# Patient Record
Sex: Female | Born: 1985 | Race: Black or African American | Hispanic: No | Marital: Single | Smoking: Former smoker
Health system: Southern US, Community
[De-identification: ages and names within clinical notes are randomized; demographics above are authoritative.]

## PROBLEM LIST (undated history)

## (undated) ENCOUNTER — Inpatient Hospital Stay (HOSPITAL_COMMUNITY): Payer: Self-pay

## (undated) DIAGNOSIS — K219 Gastro-esophageal reflux disease without esophagitis: Secondary | ICD-10-CM

## (undated) DIAGNOSIS — F909 Attention-deficit hyperactivity disorder, unspecified type: Secondary | ICD-10-CM

## (undated) DIAGNOSIS — F419 Anxiety disorder, unspecified: Secondary | ICD-10-CM

## (undated) DIAGNOSIS — F329 Major depressive disorder, single episode, unspecified: Secondary | ICD-10-CM

## (undated) DIAGNOSIS — F32A Depression, unspecified: Secondary | ICD-10-CM

## (undated) HISTORY — PX: NO PAST SURGERIES: SHX2092

## (undated) HISTORY — DX: Major depressive disorder, single episode, unspecified: F32.9

## (undated) HISTORY — PX: LEEP: SHX91

## (undated) HISTORY — DX: Depression, unspecified: F32.A

---
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
## 2005-06-04 ENCOUNTER — Emergency Department (HOSPITAL_COMMUNITY): Admission: EM | Admit: 2005-06-04 | Discharge: 2005-06-04 | Payer: Self-pay | Admitting: Emergency Medicine

## 2008-05-08 ENCOUNTER — Emergency Department (HOSPITAL_COMMUNITY): Admission: EM | Admit: 2008-05-08 | Discharge: 2008-05-08 | Payer: Self-pay | Admitting: Emergency Medicine

## 2008-08-07 ENCOUNTER — Emergency Department (HOSPITAL_COMMUNITY): Admission: EM | Admit: 2008-08-07 | Discharge: 2008-08-07 | Payer: Self-pay | Admitting: Emergency Medicine

## 2008-08-20 ENCOUNTER — Ambulatory Visit (HOSPITAL_COMMUNITY): Admission: RE | Admit: 2008-08-20 | Discharge: 2008-08-20 | Payer: Self-pay | Admitting: Obstetrics and Gynecology

## 2008-08-24 ENCOUNTER — Inpatient Hospital Stay (HOSPITAL_COMMUNITY): Admission: AD | Admit: 2008-08-24 | Discharge: 2008-08-24 | Payer: Self-pay | Admitting: Obstetrics and Gynecology

## 2008-10-14 ENCOUNTER — Emergency Department (HOSPITAL_COMMUNITY): Admission: EM | Admit: 2008-10-14 | Discharge: 2008-10-14 | Payer: Self-pay | Admitting: Emergency Medicine

## 2011-06-24 LAB — HCG, QUANTITATIVE, PREGNANCY: hCG, Beta Chain, Quant, S: 307 m[IU]/mL — ABNORMAL HIGH (ref <5)

## 2011-06-24 LAB — ABO/RH: ABO/RH(D): B POS

## 2012-07-30 DIAGNOSIS — Z8759 Personal history of other complications of pregnancy, childbirth and the puerperium: Secondary | ICD-10-CM

## 2012-08-01 DIAGNOSIS — Z789 Other specified health status: Secondary | ICD-10-CM | POA: Diagnosis present

## 2012-11-08 ENCOUNTER — Emergency Department (HOSPITAL_COMMUNITY)
Admission: EM | Admit: 2012-11-08 | Discharge: 2012-11-08 | Disposition: A | Discharge status: Home / Self Care | Payer: Medicaid Other | Attending: Emergency Medicine | Admitting: Emergency Medicine

## 2012-11-08 DIAGNOSIS — F3289 Other specified depressive episodes: Secondary | ICD-10-CM | POA: Insufficient documentation

## 2012-11-08 DIAGNOSIS — R5381 Other malaise: Secondary | ICD-10-CM | POA: Insufficient documentation

## 2012-11-08 DIAGNOSIS — G479 Sleep disorder, unspecified: Secondary | ICD-10-CM | POA: Insufficient documentation

## 2012-11-08 DIAGNOSIS — F39 Unspecified mood [affective] disorder: Secondary | ICD-10-CM | POA: Insufficient documentation

## 2012-11-08 DIAGNOSIS — Z3202 Encounter for pregnancy test, result negative: Secondary | ICD-10-CM | POA: Insufficient documentation

## 2012-11-08 DIAGNOSIS — R5383 Other fatigue: Secondary | ICD-10-CM | POA: Insufficient documentation

## 2012-11-08 DIAGNOSIS — F411 Generalized anxiety disorder: Secondary | ICD-10-CM | POA: Insufficient documentation

## 2012-11-08 DIAGNOSIS — F329 Major depressive disorder, single episode, unspecified: Secondary | ICD-10-CM | POA: Insufficient documentation

## 2012-11-08 LAB — CBC WITH DIFFERENTIAL/PLATELET
Basophils Absolute: 0 10*3/uL (ref 0.0–0.1)
Eosinophils Absolute: 0.1 10*3/uL (ref 0.0–0.7)
Eosinophils Relative: 1 % (ref 0–5)
Lymphocytes Relative: 26 % (ref 12–46)
MCH: 32 pg (ref 26.0–34.0)
MCV: 95.5 fL (ref 78.0–100.0)
Neutrophils Relative %: 64 % (ref 43–77)
Platelets: 257 10*3/uL (ref 150–400)
RDW: 13.2 % (ref 11.5–15.5)
WBC: 6.8 10*3/uL (ref 4.0–10.5)

## 2012-11-08 LAB — BASIC METABOLIC PANEL
Calcium: 8.8 mg/dL (ref 8.4–10.5)
GFR calc non Af Amer: >90 mL/min (ref >90)
Potassium: 3.7 mEq/L (ref 3.5–5.1)
Sodium: 141 mEq/L (ref 135–145)

## 2012-11-08 LAB — RAPID URINE DRUG SCREEN, HOSP PERFORMED
Cocaine: NOT DETECTED
Opiates: NOT DETECTED

## 2012-11-08 LAB — URINALYSIS, ROUTINE W REFLEX MICROSCOPIC
Nitrite: NEGATIVE
Protein, ur: 30 mg/dL — AB
Specific Gravity, Urine: 1.036 — ABNORMAL HIGH (ref 1.005–1.030)
Urobilinogen, UA: 0.2 mg/dL (ref 0.0–1.0)

## 2012-11-08 LAB — URINE MICROSCOPIC-ADD ON

## 2012-11-08 MED ORDER — LORAZEPAM 1 MG PO TABS: 1.0000 mg | ORAL_TABLET | Freq: Three times a day (TID) | ORAL | Status: DC | PRN

## 2012-11-08 MED ORDER — IBUPROFEN 600 MG PO TABS: 600.0000 mg | ORAL_TABLET | Freq: Three times a day (TID) | ORAL | Status: DC | PRN

## 2012-11-08 MED ORDER — ALUM & MAG HYDROXIDE-SIMETH 200-200-20 MG/5ML PO SUSP: 30.0000 mL | ORAL | Status: DC | PRN

## 2012-11-08 MED ORDER — ZOLPIDEM TARTRATE 5 MG PO TABS: 5.0000 mg | ORAL_TABLET | Freq: Every evening | ORAL | Status: DC | PRN

## 2012-11-08 MED ORDER — ONDANSETRON HCL 4 MG PO TABS: 4.0000 mg | ORAL_TABLET | Freq: Three times a day (TID) | ORAL | Status: DC | PRN

## 2012-11-08 MED ORDER — ACETAMINOPHEN 325 MG PO TABS: 650.0000 mg | ORAL_TABLET | ORAL | Status: DC | PRN

## 2012-11-08 MED ORDER — CITALOPRAM HYDROBROMIDE 10 MG PO TABS: 10.0000 mg | ORAL_TABLET | Freq: Every day | ORAL | Status: DC

## 2012-11-08 NOTE — ED Notes (Signed)
Pt states that she has had a hx of depression and had a breakup and has not been able to sleep or get out of bed or eat. Denies SI/HI states that she wants help for depression. Pt is tearful at this time and is here with mother.

## 2012-11-08 NOTE — ED Provider Notes (Signed)
History     CSN: [id number]  Arrival date & time 11/08/12  1331   First MD Initiated Contact with Patient 11/08/12 1407      Chief Complaint  Patient presents with  . Depression    (Consider location/radiation/quality/duration/timing/severity/associated sxs/prior treatment) HPI Comments: Pt feeling "depressed" after break up with boyfriend. Admits difficulty sleeping, loss of interests, feelings of worthlessness, lack of energy, difficulty concentrating, loss of appetite, feelings of anxiety Denies SI/HI, audio/visual hallucinations. Requesting behavioral health assistance.    No past medical history on file.  No past surgical history on file.  No family history on file.  History  Substance Use Topics  . Smoking status: Not on file  . Smokeless tobacco: Not on file  . Alcohol Use: Not on file    OB History   No data available      Review of Systems  Constitutional: Negative for fever and diaphoresis.  HENT: Negative for neck pain and neck stiffness.   Eyes: Negative for visual disturbance.  Respiratory: Negative for apnea, chest tightness and shortness of breath.   Cardiovascular: Negative for chest pain and palpitations.  Gastrointestinal: Negative for nausea, vomiting, diarrhea and constipation.  Genitourinary: Negative for dysuria.  Musculoskeletal: Negative for gait problem.  Skin: Negative for rash.  Neurological: Negative for dizziness, weakness, light-headedness, numbness and headaches.  Psychiatric/Behavioral: Positive for sleep disturbance and dysphoric mood. Negative for suicidal ideas, hallucinations, self-injury and agitation.    Allergies  Review of patient's allergies indicates no known allergies.  Home Medications  No current outpatient prescriptions on file.  BP 119/82  Pulse 77  Temp(Src) 97.8 F (36.6 C) (Oral)  SpO2 100%  Physical Exam  Nursing note and vitals reviewed. Constitutional: She is oriented to person, place, and time. She  appears well-developed and well-nourished.  tearful  HENT:  Head: Normocephalic and atraumatic.  Eyes: EOM are normal. Pupils are equal, round, and reactive to light.  Neck: Normal range of motion. Neck supple.  No meningeal signs  Cardiovascular: Normal rate, regular rhythm and normal heart sounds.  Exam reveals no gallop and no friction rub.   No murmur heard. Pulmonary/Chest: Effort normal and breath sounds normal. No respiratory distress. She has no wheezes. She has no rales. She exhibits no tenderness.  Abdominal: Soft. Bowel sounds are normal. She exhibits no distension. There is no tenderness. There is no rebound and no guarding.  Musculoskeletal: Normal range of motion. She exhibits no edema and no tenderness.  Neurological: She is alert and oriented to person, place, and time. No cranial nerve deficit.  Skin: Skin is warm and dry. She is not diaphoretic. No erythema.  Psychiatric: Her behavior is normal. Thought content normal.  Dysphoric mood, admits difficulty sleeping, loss of interests, feelings of worthlessness, lack of energy, difficulty concentrating, loss of appetite, feelings of anxiety     ED Course  Procedures (including critical care time)  Labs Reviewed  URINALYSIS, ROUTINE W REFLEX MICROSCOPIC  CBC WITH DIFFERENTIAL  BASIC METABOLIC PANEL  URINE RAPID DRUG SCREEN (HOSP PERFORMED)  ETHANOL  PREGNANCY, URINE    Results for orders placed during the hospital encounter of 11/08/12  CBC WITH DIFFERENTIAL      Result Value Range   WBC 6.8  4.0 - 10.5 K/uL   RBC 4.41  3.87 - 5.11 MIL/uL   Hemoglobin 14.1  12.0 - 15.0 g/dL   HCT 40.9  81.1 - 91.4 %   MCV 95.5  78.0 - 100.0 fL   MCH 32.0  26.0 - 34.0 pg   MCHC 33.5  30.0 - 36.0 g/dL   RDW 47.8  29.5 - 62.1 %   Platelets 257  150 - 400 K/uL   Neutrophils Relative 64  43 - 77 %   Neutro Abs 4.4  1.7 - 7.7 K/uL   Lymphocytes Relative 26  12 - 46 %   Lymphs Abs 1.8  0.7 - 4.0 K/uL   Monocytes Relative 9  3 - 12  %   Monocytes Absolute 0.6  0.1 - 1.0 K/uL   Eosinophils Relative 1  0 - 5 %   Eosinophils Absolute 0.1  0.0 - 0.7 K/uL   Basophils Relative 0  0 - 1 %   Basophils Absolute 0.0  0.0 - 0.1 K/uL    No results found.   No diagnosis found at time of transfer of care.    MDM  Pt presents to the ED for medical clearance.  Pt is not currently having SI or HI ideations.  The patient currently does not have any acute physical complaints and is in no acute distress. The patients demeanor is dysphoric. Admits difficulty sleeping, loss of interests, feelings of worthlessness, lack of energy, difficulty concentrating, loss of appetite, feelings of anxiety The patient was brought to ED by self and mother and is voluntarily seeking behavioral health assistance. ACT consult was appreciated and pt was moved to Psych ED for further evaluation.    Glade Nurse, PA-C 11/08/12 1451

## 2012-11-08 NOTE — ED Provider Notes (Signed)
Medical screening examination/treatment/procedure(s) were conducted as a shared visit with non-physician practitioner(s) and myself.  I personally evaluated the patient during the encounter  No homicidal or suicidal thoughts.  I do not believe the patient needs to be admitted to a psychiatric hospital.  Social work has been involved to provide counseling resources and assistance with making information.  Home with initiation of Celexa and when necessary Ativan.  Recommend followup with psychiatry and calcific services.  Lyanne Co, MD 11/08/12 [phone]

## 2012-11-08 NOTE — Progress Notes (Signed)
CSW met with pt at bedside along with pt mother. Pt denies SI/HI/AH/VH. Patient plans to follow up with outpatient providers. CSW and pt discussed applying for medicaid as patient mother requested, csw provided patient with information. CSW also discussed mental health providers that are able to provide services to patients that are uninsured. Pt and pt mother thanked csw for concern and support.   Catha Gosselin, LCSWA  [phone] .11/08/2012 1710pm

## 2013-03-30 ENCOUNTER — Encounter (HOSPITAL_COMMUNITY): Payer: Self-pay | Admitting: Emergency Medicine

## 2013-03-30 ENCOUNTER — Emergency Department (HOSPITAL_COMMUNITY)
Admission: EM | Admit: 2013-03-30 | Discharge: 2013-03-30 | Disposition: A | Discharge status: Home / Self Care | Payer: Medicaid Other | Attending: Emergency Medicine | Admitting: Emergency Medicine

## 2013-03-30 DIAGNOSIS — L259 Unspecified contact dermatitis, unspecified cause: Secondary | ICD-10-CM | POA: Insufficient documentation

## 2013-03-30 MED ORDER — PREDNISONE (PAK) 10 MG PO TABS: 10.0000 mg | ORAL_TABLET | Freq: Every day | ORAL | Status: DC

## 2013-03-30 MED ORDER — DIPHENHYDRAMINE HCL 25 MG PO TABS: 25.0000 mg | ORAL_TABLET | Freq: Four times a day (QID) | ORAL | Status: DC

## 2013-03-30 NOTE — ED Notes (Signed)
Pt from home c/o itchy rash to L upper arm. Pt reports that she has had x1week. Pt has reddish, oval shaped rash , approx 2.5 in to inside of upper arm. Pt states that she has used calamine, cortisone cream and benadryl with no relief. Pt is A&O and in NAD

## 2013-03-30 NOTE — ED Provider Notes (Signed)
History    CSN: [id number] Arrival date & time 03/30/13  1339  First MD Initiated Contact with Patient 03/30/13 1430     Chief Complaint  Patient presents with  . Rash   (Consider location/radiation/quality/duration/timing/severity/associated sxs/prior Treatment) HPI Comments: Patient reports itchy rash on left upper arm x 1 week.  States she does not know of any exposure to the area.  Denies change in any personal care products.  Denies tick bites.  Pt does have eczema but this is different from her typical eczema.  States she feels completely fine with exception of this area of her skin.  Denies fevers, chills, recent illness, cough, weakness or numbness of her arm.  Has used various OTC creams and lotions without improvement.   Patient is a 27 y.o. female presenting with rash. The history is provided by the patient.  Rash Associated symptoms: no chills and no fever    History reviewed. No pertinent past medical history. History reviewed. No pertinent past surgical history. No family history on file. History  Substance Use Topics  . Smoking status: Never Smoker   . Smokeless tobacco: Not on file  . Alcohol Use: Yes     Comment: socially   OB History   Grav Para Term Preterm Abortions TAB SAB Ect Mult Living                 Review of Systems  Constitutional: Negative for fever and chills.  Skin: Positive for rash.  Neurological: Negative for weakness and numbness.    Allergies  Review of patient's allergies indicates no known allergies.  Home Medications   Current Outpatient Rx  Name  Route  Sig  Dispense  Refill  . calamine lotion   Topical   Apply 1 application topically as needed (for rash).         . diphenhydrAMINE (BENADRYL) 25 mg capsule   Oral   Take 25 mg by mouth every 6 (six) hours as needed for itching.         . hydrocortisone cream 1 %   Topical   Apply 1 application topically 2 (two) times daily as needed (for rash).          BP  109/67  Pulse 73  Temp(Src) 99 F (37.2 C) (Oral)  Resp 16  SpO2 98%  LMP 03/07/2013 Physical Exam  Nursing note and vitals reviewed. Constitutional: She appears well-developed and well-nourished. No distress.  HENT:  Head: Normocephalic and atraumatic.  Neck: Neck supple.  Pulmonary/Chest: Effort normal.  Neurological: She is alert.  Skin: She is not diaphoretic.       ED Course  Procedures (including critical care time) Labs Reviewed - No data to display No results found.   1. Contact dermatitis     MDM  Patient with one area of raised erythematous rash on left upper arm, medial aspect.  It is ovular but does not have central clearing, targetoid appearance, and does not have distinct raised border.  Truly appears to be contact dermatitis.  Nontender.  No e/o superinfection.  No hx of exposures to chemicals, plants, ticks, or other insects.  Discussed findings, treatment, and follow up with patient.  Encouraged pt to return for any worsening symptoms or for lack of improvement. (pt does not have PCP). Pt given return precautions.  Pt verbalizes understanding and agrees with plan.     I doubt any other EMC precluding discharge at this time including, but not necessarily limited to the following:  Lyme disease, tinea corporis.    Trixie Dredge, PA-C 03/30/13 1538

## 2013-03-31 NOTE — ED Provider Notes (Signed)
Medical screening examination/treatment/procedure(s) were performed by non-physician practitioner and as supervising physician I was immediately available for consultation/collaboration.  Derwood Kaplan, MD 03/31/13 [phone]

## 2013-08-09 ENCOUNTER — Encounter: Payer: Self-pay | Admitting: Obstetrics and Gynecology

## 2013-08-09 ENCOUNTER — Ambulatory Visit (INDEPENDENT_AMBULATORY_CARE_PROVIDER_SITE_OTHER): Payer: Medicaid Other | Admitting: Obstetrics and Gynecology

## 2013-08-09 ENCOUNTER — Ambulatory Visit (HOSPITAL_COMMUNITY)
Admission: RE | Admit: 2013-08-09 | Discharge: 2013-08-09 | Disposition: A | Discharge status: Home / Self Care | Payer: Medicaid Other | Source: Ambulatory Visit | Attending: Obstetrics and Gynecology | Admitting: Obstetrics and Gynecology

## 2013-08-09 ENCOUNTER — Other Ambulatory Visit: Payer: Self-pay | Admitting: Obstetrics and Gynecology

## 2013-08-09 ENCOUNTER — Other Ambulatory Visit (HOSPITAL_COMMUNITY)
Admission: RE | Admit: 2013-08-09 | Discharge: 2013-08-09 | Disposition: A | Discharge status: Home / Self Care | Payer: Medicaid Other | Source: Ambulatory Visit | Attending: Obstetrics and Gynecology | Admitting: Obstetrics and Gynecology

## 2013-08-09 VITALS — BP 118/83 | Temp 97.0°F | Ht 62.0 in | Wt 121.1 lb

## 2013-08-09 DIAGNOSIS — Z113 Encounter for screening for infections with a predominantly sexual mode of transmission: Secondary | ICD-10-CM | POA: Insufficient documentation

## 2013-08-09 DIAGNOSIS — O2621 Pregnancy care for patient with recurrent pregnancy loss, first trimester: Secondary | ICD-10-CM

## 2013-08-09 DIAGNOSIS — Z01419 Encounter for gynecological examination (general) (routine) without abnormal findings: Secondary | ICD-10-CM | POA: Insufficient documentation

## 2013-08-09 DIAGNOSIS — Z3491 Encounter for supervision of normal pregnancy, unspecified, first trimester: Secondary | ICD-10-CM

## 2013-08-09 DIAGNOSIS — O2620 Pregnancy care for patient with recurrent pregnancy loss, unspecified trimester: Secondary | ICD-10-CM | POA: Insufficient documentation

## 2013-08-09 DIAGNOSIS — Z3689 Encounter for other specified antenatal screening: Secondary | ICD-10-CM | POA: Insufficient documentation

## 2013-08-09 DIAGNOSIS — Z8742 Personal history of other diseases of the female genital tract: Secondary | ICD-10-CM | POA: Insufficient documentation

## 2013-08-09 DIAGNOSIS — Z3490 Encounter for supervision of normal pregnancy, unspecified, unspecified trimester: Secondary | ICD-10-CM

## 2013-08-09 LAB — POCT URINALYSIS DIP (DEVICE)
Nitrite: NEGATIVE
Protein, ur: NEGATIVE mg/dL
Specific Gravity, Urine: 1.02 (ref 1.005–1.030)
Urobilinogen, UA: 0.2 mg/dL (ref 0.0–1.0)

## 2013-08-09 LAB — OB RESULTS CONSOLE GC/CHLAMYDIA: Gonorrhea: NEGATIVE

## 2013-08-09 NOTE — Progress Notes (Signed)
P=78 initial prenatal visit with labs

## 2013-08-09 NOTE — Progress Notes (Signed)
Subjective:    Kelli Bean is a [id number] [redacted]w[redacted]d being seen today for her first obstetrical visit.  Her obstetrical history is significant for ectopic x1 tx MTX last year; SABx2 at 5 wks. Patient does intend to breast feed. Pregnancy history fully reviewed.  Patient reports no complaints and has subjective sx of pregnnacy/ Very anxious about past OB hx and she and her mother are requesting progesterone as that was done with her last pregnnacy.Ceasar Mons Vitals:   08/09/13 9811 08/09/13 0823  BP: 118/83   Temp: 97 F (36.1 C)   Height:  5\' 2"  (1.575 m)  Weight: 121 lb 1.6 oz (54.931 kg)     HISTORY: OB History  Gravida Para Term Preterm AB SAB TAB Ectopic Multiple Living  4    3 2  1       # Outcome Date GA Lbr Len/2nd Weight Sex Delivery Anes PTL Lv  4 CUR           3 ECT 2013 [redacted]w[redacted]d         2 SAB 2012 [redacted]w[redacted]d         1 SAB 2009 [redacted]w[redacted]d            Past Medical History  Diagnosis Date  . Depression     2008 on medication   History reviewed. No pertinent past surgical history. Family History  Problem Relation Age of Onset  . Hypertension Mother   . Hypertension Father   . Cancer Maternal Grandmother   . Diabetes Maternal Grandmother   . Cancer Paternal Grandmother   . Diabetes Paternal Grandmother      Exam    Uterus:     Pelvic Exam:    Perineum: No Hemorrhoids, Normal Perineum   Vulva: normal   Vagina:  normal mucosa, normal discharge   Uterus Sl enlarged, retroverted   Cervix: nulliparous appearance and L/C   Adnexa: not evaluated   Bony Pelvis: average  System: Breast:  normal appearance, no masses or tenderness   Skin: normal coloration and turgor, no rashes    Neurologic: oriented, normal, grossly non-focal   Extremities: normal strength, tone, and muscle mass   HEENT PERRLA   Mouth/Teeth mucous membranes moist, pharynx normal without lesions   Neck supple and no masses   Cardiovascular: regular rate and rhythm, no murmurs or gallops   Respiratory:   appears well, vitals normal, no respiratory distress, acyanotic, normal RR, ear and throat exam is normal, neck free of mass or lymphadenopathy, chest clear, no wheezing, crepitations, rhonchi, normal symmetric air entry   Abdomen: soft, NT   Urinary: urethral meatus normal      Assessment:    Pregnancy: [id number] at [redacted]w[redacted]d Patient Active Problem List   Diagnosis Date Noted  . 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 Supervision of normal pregnancy in first trimester 08/09/2013        Plan:    Bedside US by Diane for viability> YS seen, no embryo seen> to [place] [facility] Initial labs drawn. Prenatal vitamins. Problem list reviewed and updated. Genetic Screening discussed First Screen: undecided.  Ultrasound discussed; fetal survey: requested.  Follow up in 4 weeks. 50% of 30 min visit spent on counseling and coordination of care.  D/W pt and mother re not meeting criteria for RPL and data on progesterone controversial and they understand. C/W Dr. Debroah Loop may prescribe progesterone 200 mg did first tri  if viable IUP  Kelli Bean,Kelli Bean 08/09/2013

## 2013-08-10 LAB — OBSTETRIC PANEL
Antibody Screen: NEGATIVE
Basophils Absolute: 0 10*3/uL (ref 0.0–0.1)
Basophils Relative: 0 % (ref 0–1)
HCT: 35.3 % — ABNORMAL LOW (ref 36.0–46.0)
Hemoglobin: 12.3 g/dL (ref 12.0–15.0)
Hepatitis B Surface Ag: NEGATIVE
Lymphs Abs: 2.4 10*3/uL (ref 0.7–4.0)
MCH: 32.6 pg (ref 26.0–34.0)
MCHC: 34.8 g/dL (ref 30.0–36.0)
Monocytes Absolute: 0.8 10*3/uL (ref 0.1–1.0)
Monocytes Relative: 7 % (ref 3–12)
Neutro Abs: 8.7 10*3/uL — ABNORMAL HIGH (ref 1.7–7.7)
Neutrophils Relative %: 72 % (ref 43–77)
RBC: 3.77 MIL/uL — ABNORMAL LOW (ref 3.87–5.11)
Rh Type: POSITIVE

## 2013-08-10 LAB — ALCOHOL METABOLITE (ETG), URINE: Ethyl Glucuronide (EtG): NEGATIVE ng/mL

## 2013-08-13 LAB — HEMOGLOBINOPATHY EVALUATION
Hemoglobin Other: 0 %
Hgb A2 Quant: 2.8 % (ref 2.2–3.2)
Hgb S Quant: 0 %

## 2013-08-13 LAB — PRESCRIPTION MONITORING PROFILE (19 PANEL)
Amphetamine/Meth: NEGATIVE ng/mL
Barbiturate Screen, Urine: NEGATIVE ng/mL
Carisoprodol, Urine: NEGATIVE ng/mL
Creatinine, Urine: 143.38 mg/dL (ref >20.0)
Meperidine, Ur: NEGATIVE ng/mL
Methadone Screen, Urine: NEGATIVE ng/mL
Nitrites, Initial: NEGATIVE ug/mL
Opiate Screen, Urine: NEGATIVE ng/mL
Oxycodone Screen, Ur: NEGATIVE ng/mL
Phencyclidine, Ur: NEGATIVE ng/mL
Propoxyphene: NEGATIVE ng/mL
Tapentadol, urine: NEGATIVE ng/mL
Tramadol Scrn, Ur: NEGATIVE ng/mL
pH, Initial: 5.7 pH (ref 4.5–8.9)

## 2013-08-13 LAB — CANNABANOIDS (GC/LC/MS), URINE: THC-COOH (GC/LC/MS), ur confirm: 137 ng/mL — AB

## 2013-09-06 ENCOUNTER — Encounter: Payer: Self-pay | Admitting: Advanced Practice Midwife

## 2013-09-06 ENCOUNTER — Ambulatory Visit (INDEPENDENT_AMBULATORY_CARE_PROVIDER_SITE_OTHER): Payer: Medicaid Other | Admitting: Advanced Practice Midwife

## 2013-09-06 VITALS — BP 111/72 | Temp 98.3°F | Wt 124.2 lb

## 2013-09-06 DIAGNOSIS — O2621 Pregnancy care for patient with recurrent pregnancy loss, first trimester: Secondary | ICD-10-CM

## 2013-09-06 DIAGNOSIS — Z3481 Encounter for supervision of other normal pregnancy, first trimester: Secondary | ICD-10-CM

## 2013-09-06 DIAGNOSIS — Z23 Encounter for immunization: Secondary | ICD-10-CM

## 2013-09-06 DIAGNOSIS — O2620 Pregnancy care for patient with recurrent pregnancy loss, unspecified trimester: Secondary | ICD-10-CM

## 2013-09-06 LAB — POCT URINALYSIS DIP (DEVICE)
Bilirubin Urine: NEGATIVE
Ketones, ur: NEGATIVE mg/dL
Specific Gravity, Urine: 1.025 (ref 1.005–1.030)
pH: 6 (ref 5.0–8.0)

## 2013-09-06 NOTE — Progress Notes (Signed)
P=97  C/o severe cramps for a few days, but stopped  Yesterday, denies diarrhea, denies vaginal bleeding or discharge

## 2013-09-06 NOTE — Patient Instructions (Signed)
Pregnancy - First Trimester  During sexual intercourse, millions of sperm go into the vagina. Only 1 sperm will penetrate and fertilize the female egg while it is in the Fallopian tube. One week later, the fertilized egg implants into the wall of the uterus. An embryo begins to develop into a baby. At 6 to 8 weeks, the eyes and face are formed and the heartbeat can be seen on ultrasound. At the end of 12 weeks (first trimester), all the baby's organs are formed. Now that you are pregnant, you will want to do everything you can to have a healthy baby. Two of the most important things are to get good prenatal care and follow your caregiver's instructions. Prenatal care is all the medical care you receive before the baby's birth. It is given to prevent, find, and treat problems during the pregnancy and childbirth.  PRENATAL EXAMS  · During prenatal visits, your weight, blood pressure, and urine are checked. This is done to make sure you are healthy and progressing normally during the pregnancy.  · A pregnant woman should gain 25 to 35 pounds during the pregnancy. However, if you are overweight or underweight, your caregiver will advise you regarding your weight.  · Your caregiver will ask and answer questions for you.  · Blood work, cervical cultures, other necessary tests, and a Pap test are done during your prenatal exams. These tests are done to check on your health and the probable health of your baby. Tests are strongly recommended and done for HIV with your permission. This is the virus that causes AIDS. These tests are done because medicines can be given to help prevent your baby from being born with this infection should you have been infected without knowing it. Blood work is also used to find out your blood type, previous infections, and follow your blood levels (hemoglobin).  · Low hemoglobin (anemia) is common during pregnancy. Iron and vitamins are given to help prevent this. Later in the pregnancy, blood  tests for diabetes will be done along with any other tests if any problems develop.  · You may need other tests to make sure you and the baby are doing well.  CHANGES DURING THE FIRST TRIMESTER   Your body goes through many changes during pregnancy. They vary from person to person. Talk to your caregiver about changes you notice and are concerned about. Changes can include:  · Your menstrual period stops.  · The egg and sperm carry the genes that determine what you look like. Genes from you and your partner are forming a baby. The female genes determine whether the baby is a boy or a girl.  · Your body increases in girth and you may feel bloated.  · Feeling sick to your stomach (nauseous) and throwing up (vomiting). If the vomiting is uncontrollable, call your caregiver.  · Your breasts will begin to enlarge and become tender.  · Your nipples may stick out more and become darker.  · The need to urinate more. Painful urination may mean you have a bladder infection.  · Tiring easily.  · Loss of appetite.  · Cravings for certain kinds of food.  · At first, you may gain or lose a couple of pounds.  · You may have changes in your emotions from day to day (excited to be pregnant or concerned something may go wrong with the pregnancy and baby).  · You may have more vivid and strange dreams.  HOME CARE INSTRUCTIONS   ·   It is very important to avoid all smoking, alcohol and non-prescribed drugs during your pregnancy. These affect the formation and growth of the baby. Avoid chemicals while pregnant to ensure the delivery of a healthy infant.  · Start your prenatal visits by the 12th week of pregnancy. They are usually scheduled monthly at first, then more often in the last 2 months before delivery. Keep your caregiver's appointments. Follow your caregiver's instructions regarding medicine use, blood and lab tests, exercise, and diet.  · During pregnancy, you are providing food for you and your baby. Eat regular, well-balanced  meals. Choose foods such as meat, fish, milk and other low fat dairy products, vegetables, fruits, and whole-grain breads and cereals. Your caregiver will tell you of the ideal weight gain.  · You can help morning sickness by keeping soda crackers at the bedside. Eat a couple before arising in the morning. You may want to use the crackers without salt on them.  · Eating 4 to 5 small meals rather than 3 large meals a day also may help the nausea and vomiting.  · Drinking liquids between meals instead of during meals also seems to help nausea and vomiting.  · A physical sexual relationship may be continued throughout pregnancy if there are no other problems. Problems may be early (premature) leaking of amniotic fluid from the membranes, vaginal bleeding, or belly (abdominal) pain.  · Exercise regularly if there are no restrictions. Check with your caregiver or physical therapist if you are unsure of the safety of some of your exercises. Greater weight gain will occur in the last 2 trimesters of pregnancy. Exercising will help:  · Control your weight.  · Keep you in shape.  · Prepare you for labor and delivery.  · Help you lose your pregnancy weight after you deliver your baby.  · Wear a good support or jogging bra for breast tenderness during pregnancy. This may help if worn during sleep too.  · Ask when prenatal classes are available. Begin classes when they are offered.  · Do not use hot tubs, steam rooms, or saunas.  · Wear your seat belt when driving. This protects you and your baby if you are in an accident.  · Avoid raw meat, uncooked cheese, cat litter boxes, and soil used by cats throughout the pregnancy. These carry germs that can cause birth defects in the baby.  · The first trimester is a good time to visit your dentist for your dental health. Getting your teeth cleaned is okay. Use a softer toothbrush and brush gently during pregnancy.  · Ask for help if you have financial, counseling, or nutritional needs  during pregnancy. Your caregiver will be able to offer counseling for these needs as well as refer you for other special needs.  · Do not take any medicines or herbs unless told by your caregiver.  · Inform your caregiver if there is any mental or physical domestic violence.  · Make a list of emergency phone numbers of family, friends, hospital, and police and fire departments.  · Write down your questions. Take them to your prenatal visit.  · Do not douche.  · Do not cross your legs.  · If you have to stand for long periods of time, rotate you feet or take small steps in a circle.  · You may have more vaginal secretions that may require a sanitary pad. Do not use tampons or scented sanitary pads.  MEDICINES AND DRUG USE IN PREGNANCY  ·   Take prenatal vitamins as directed. The vitamin should contain 1 milligram of folic acid. Keep all vitamins out of reach of children. Only a couple vitamins or tablets containing iron may be fatal to a baby or young child when ingested.  · Avoid use of all medicines, including herbs, over-the-counter medicines, not prescribed or suggested by your caregiver. Only take over-the-counter or prescription medicines for pain, discomfort, or fever as directed by your caregiver. Do not use aspirin, ibuprofen, or naproxen unless directed by your caregiver.  · Let your caregiver also know about herbs you may be using.  · Alcohol is related to a number of birth defects. This includes fetal alcohol syndrome. All alcohol, in any form, should be avoided completely. Smoking will cause low birth rate and premature babies.  · Street or illegal drugs are very harmful to the baby. They are absolutely forbidden. A baby born to an addicted mother will be addicted at birth. The baby will go through the same withdrawal an adult does.  · Let your caregiver know about any medicines that you have to take and for what reason you take them.  SEEK MEDICAL CARE IF:   You have any concerns or worries during your  pregnancy. It is better to call with your questions if you feel they cannot wait, rather than worry about them.  SEEK IMMEDIATE MEDICAL CARE IF:   · An unexplained oral temperature above 102° F (38.9° C) develops, or as your caregiver suggests.  · You have leaking of fluid from the vagina (birth canal). If leaking membranes are suspected, take your temperature and inform your caregiver of this when you call.  · There is vaginal spotting or bleeding. Notify your caregiver of the amount and how many pads are used.  · You develop a bad smelling vaginal discharge with a change in the color.  · You continue to feel sick to your stomach (nauseated) and have no relief from remedies suggested. You vomit blood or coffee ground-like materials.  · You lose more than 2 pounds of weight in 1 week.  · You gain more than 2 pounds of weight in 1 week and you notice swelling of your face, hands, feet, or legs.  · You gain 5 pounds or more in 1 week (even if you do not have swelling of your hands, face, legs, or feet).  · You get exposed to German measles and have never had them.  · You are exposed to fifth disease or chickenpox.  · You develop belly (abdominal) pain. Round ligament discomfort is a common non-cancerous (benign) cause of abdominal pain in pregnancy. Your caregiver still must evaluate this.  · You develop headache, fever, diarrhea, pain with urination, or shortness of breath.  · You fall or are in a car accident or have any kind of trauma.  · There is mental or physical violence in your home.  Document Released: 08/30/2001 Document Revised: 05/30/2012 Document Reviewed: 03/03/2009  [employer]® Patient Information ©2014 [employer].

## 2013-09-06 NOTE — Addendum Note (Signed)
Addended by: Faythe Casa on: 09/06/2013 10:49 AM   Modules accepted: Orders

## 2013-09-06 NOTE — Progress Notes (Signed)
Happy to hear FHTs.  First Screen ordered

## 2013-09-08 LAB — CULTURE, OB URINE: Colony Count: 45000

## 2013-09-19 NOTE — L&D Delivery Note (Signed)
Attestation of Attending Supervision of Advanced Practitioner (PA/CNM/NP): Evaluation and management procedures were performed by the Advanced Practitioner under my supervision and collaboration.  I have reviewed the Advanced Practitioner's note and chart, and I agree with the management and plan.  Zeniyah Peaster, MD, FACOG Attending Obstetrician & Gynecologist Faculty Practice, [facility]   

## 2013-09-19 NOTE — L&D Delivery Note (Signed)
Delivery Note At 6:35 PM a viable and healthy female was delivered via Vaginal, Spontaneous Delivery (Presentation: Left Occiput Anterior).  APGAR: 8, 9; weight pending .   Placenta status: Intact, Spontaneous.  Cord: 3 vessels with the following complications: none.  Anesthesia: Epidural  Episiotomy: None Lacerations: None Est. Blood Loss (mL): 300  Mom to postpartum.  Baby to Couplet care / Skin to Skin.  [facility],Kelli Bean 03/16/2014, 7:07 PM

## 2013-09-20 ENCOUNTER — Encounter: Payer: Self-pay | Admitting: Family Medicine

## 2013-10-02 ENCOUNTER — Ambulatory Visit (INDEPENDENT_AMBULATORY_CARE_PROVIDER_SITE_OTHER): Payer: Medicaid Other | Admitting: Family Medicine

## 2013-10-02 VITALS — BP 90/63 | Wt 122.4 lb

## 2013-10-02 DIAGNOSIS — Z3491 Encounter for supervision of normal pregnancy, unspecified, first trimester: Secondary | ICD-10-CM

## 2013-10-02 DIAGNOSIS — O2620 Pregnancy care for patient with recurrent pregnancy loss, unspecified trimester: Secondary | ICD-10-CM

## 2013-10-02 LAB — POCT URINALYSIS DIP (DEVICE)
BILIRUBIN URINE: NEGATIVE
Glucose, UA: NEGATIVE mg/dL
Hgb urine dipstick: NEGATIVE
Ketones, ur: 15 mg/dL — AB
NITRITE: NEGATIVE
Protein, ur: NEGATIVE mg/dL
Specific Gravity, Urine: 1.025 (ref 1.005–1.030)
Urobilinogen, UA: 1 mg/dL (ref 0.0–1.0)
pH: 7 (ref 5.0–8.0)

## 2013-10-02 NOTE — Progress Notes (Signed)
Ob US scheduled on 10/30/13 @ 0830

## 2013-10-02 NOTE — Patient Instructions (Signed)
Second Trimester of Pregnancy The second trimester is from week 13 through week 28, months 4 through 6. The second trimester is often a time when you feel your best. Your body has also adjusted to being pregnant, and you begin to feel better physically. Usually, morning sickness has lessened or quit completely, you may have more energy, and you may have an increase in appetite. The second trimester is also a time when the fetus is growing rapidly. At the end of the sixth month, the fetus is about 9 inches long and weighs about 1 pounds. You will likely begin to feel the baby move (quickening) between 18 and 20 weeks of the pregnancy. BODY CHANGES Your body goes through many changes during pregnancy. The changes vary from woman to woman.   Your weight will continue to increase. You will notice your lower abdomen bulging out.  You may begin to get stretch marks on your hips, abdomen, and breasts.  You may develop headaches that can be relieved by medicines approved by your caregiver.  You may urinate more often because the fetus is pressing on your bladder.  You may develop or continue to have heartburn as a result of your pregnancy.  You may develop constipation because certain hormones are causing the muscles that push waste through your intestines to slow down.  You may develop hemorrhoids or swollen, bulging veins (varicose veins).  You may have back pain because of the weight gain and pregnancy hormones relaxing your joints between the bones in your pelvis and as a result of a shift in weight and the muscles that support your balance.  Your breasts will continue to grow and be tender.  Your gums may bleed and may be sensitive to brushing and flossing.  Dark spots or blotches (chloasma, mask of pregnancy) may develop on your face. This will likely fade after the baby is born.  A dark line from your belly button to the pubic area (linea nigra) may appear. This will likely fade after the  baby is born. WHAT TO EXPECT AT YOUR PRENATAL VISITS During a routine prenatal visit:  You will be weighed to make sure you and the fetus are growing normally.  Your blood pressure will be taken.  Your abdomen will be measured to track your baby's growth.  The fetal heartbeat will be listened to.  Any test results from the previous visit will be discussed. Your caregiver may ask you:  How you are feeling.  If you are feeling the baby move.  If you have had any abnormal symptoms, such as leaking fluid, bleeding, severe headaches, or abdominal cramping.  If you have any questions. Other tests that may be performed during your second trimester include:  Blood tests that check for:  Low iron levels (anemia).  Gestational diabetes (between 24 and 28 weeks).  Rh antibodies.  Urine tests to check for infections, diabetes, or protein in the urine.  An ultrasound to confirm the proper growth and development of the baby.  An amniocentesis to check for possible genetic problems.  Fetal screens for spina bifida and Down syndrome. HOME CARE INSTRUCTIONS   Avoid all smoking, herbs, alcohol, and unprescribed drugs. These chemicals affect the formation and growth of the baby.  Follow your caregiver's instructions regarding medicine use. There are medicines that are either safe or unsafe to take during pregnancy.  Exercise only as directed by your caregiver. Experiencing uterine cramps is a good sign to stop exercising.  Continue to eat regular,   healthy meals.  Wear a good support bra for breast tenderness.  Do not use hot tubs, steam rooms, or saunas.  Wear your seat belt at all times when driving.  Avoid raw meat, uncooked cheese, cat litter boxes, and soil used by cats. These carry germs that can cause birth defects in the baby.  Take your prenatal vitamins.  Try taking a stool softener (if your caregiver approves) if you develop constipation. Eat more high-fiber foods,  such as fresh vegetables or fruit and whole grains. Drink plenty of fluids to keep your urine clear or pale yellow.  Take warm sitz baths to soothe any pain or discomfort caused by hemorrhoids. Use hemorrhoid cream if your caregiver approves.  If you develop varicose veins, wear support hose. Elevate your feet for 15 minutes, 3 4 times a day. Limit salt in your diet.  Avoid heavy lifting, wear low heel shoes, and practice good posture.  Rest with your legs elevated if you have leg cramps or low back pain.  Visit your dentist if you have not gone yet during your pregnancy. Use a soft toothbrush to brush your teeth and be gentle when you floss.  A sexual relationship may be continued unless your caregiver directs you otherwise.  Continue to go to all your prenatal visits as directed by your caregiver. SEEK MEDICAL CARE IF:   You have dizziness.  You have mild pelvic cramps, pelvic pressure, or nagging pain in the abdominal area.  You have persistent nausea, vomiting, or diarrhea.  You have a bad smelling vaginal discharge.  You have pain with urination. SEEK IMMEDIATE MEDICAL CARE IF:   You have a fever.  You are leaking fluid from your vagina.  You have spotting or bleeding from your vagina.  You have severe abdominal cramping or pain.  You have rapid weight gain or loss.  You have shortness of breath with chest pain.  You notice sudden or extreme swelling of your face, hands, ankles, feet, or legs.  You have not felt your baby move in over an hour.  You have severe headaches that do not go away with medicine.  You have vision changes. Document Released: 08/30/2001 Document Revised: 05/08/2013 Document Reviewed: 11/06/2012 [employer] Patient Information 2014 [employer].  

## 2013-10-02 NOTE — Progress Notes (Signed)
P= 88 C/o of N/V and feeling dizzy the other day and states she had to call out of work; asked pt. If she had been staying well hydrated and patient states " I think that is the problem." Advised pt. It is extremely important to stay well hydrated in pregnancy and more so when vommitting; water and Gatorades for electrolytes.

## 2013-10-02 NOTE — Progress Notes (Signed)
No Vb, no ctx, no Lof, No FM  Army [name] is a 28 y.o. G4P0030 at 597w4d presents for ROB  Discussed with Patient:  - ordered quad screen- Patient plans on breast/ bottle feeding. - Routine precautions discussed (depression, infection s/s).   Patient provided with all pertinent phone numbers for emergencies. - RTC for any VB, regular, painful cramps/ctxs occurring at a rate of >2/10 min, fever (100.5 or higher), n/v/d, any pain that is unresolving or worsening. - RTC in 4 weeks for next appt.  Problems: Patient Active Problem List   Diagnosis Date Noted  . 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 Supervision of normal pregnancy in first trimester 08/09/2013    To Do: 1. 18 week anatomy scan ordered.  Patient to schedule.  [ ]  Vaccines: WGN:FAOZFlu:recd  Tdap:  [ ]  BCM: mirena  Edu: [x ] PTL precautions; [ ]  BF class; [ ]  childbirth class; [ ]   BF counseling

## 2013-10-07 LAB — AFP, QUAD SCREEN
AFP: 25 IU/mL
CURR GEST AGE: 15.1 wks.days
Down Syndrome Scr Risk Est: 1:7760 {titer}
HCG, Total: 25746 m[IU]/mL
INH: 168.2 pg/mL
Interpretation-AFP: NEGATIVE
MOM FOR HCG: 0.71
MoM for AFP: 0.73
MoM for INH: 0.78
Open Spina bifida: NEGATIVE
Osb Risk: <1:54600 {titer}
TRI 18 SCR RISK EST: NEGATIVE
Trisomy 18 (Edward) Syndrome Interp.: 1:19500 {titer}
UE3 VALUE: 0.3 ng/mL
uE3 Mom: 0.9

## 2013-10-30 ENCOUNTER — Encounter: Payer: Self-pay | Admitting: Family Medicine

## 2013-10-30 ENCOUNTER — Ambulatory Visit (INDEPENDENT_AMBULATORY_CARE_PROVIDER_SITE_OTHER): Payer: Medicaid Other | Admitting: Family Medicine

## 2013-10-30 ENCOUNTER — Ambulatory Visit (HOSPITAL_COMMUNITY)
Admission: RE | Admit: 2013-10-30 | Discharge: 2013-10-30 | Disposition: A | Discharge status: Home / Self Care | Payer: Medicaid Other | Source: Ambulatory Visit | Attending: Family Medicine | Admitting: Family Medicine

## 2013-10-30 VITALS — BP 110/69 | Temp 97.0°F | Wt 126.4 lb

## 2013-10-30 DIAGNOSIS — Z3491 Encounter for supervision of normal pregnancy, unspecified, first trimester: Secondary | ICD-10-CM

## 2013-10-30 DIAGNOSIS — Z3689 Encounter for other specified antenatal screening: Secondary | ICD-10-CM | POA: Insufficient documentation

## 2013-10-30 DIAGNOSIS — O2620 Pregnancy care for patient with recurrent pregnancy loss, unspecified trimester: Secondary | ICD-10-CM

## 2013-10-30 LAB — POCT URINALYSIS DIP (DEVICE)
BILIRUBIN URINE: NEGATIVE
Glucose, UA: NEGATIVE mg/dL
HGB URINE DIPSTICK: NEGATIVE
Ketones, ur: NEGATIVE mg/dL
Nitrite: NEGATIVE
PH: 7 (ref 5.0–8.0)
Protein, ur: NEGATIVE mg/dL
Specific Gravity, Urine: 1.015 (ref 1.005–1.030)
Urobilinogen, UA: 0.2 mg/dL (ref 0.0–1.0)

## 2013-10-30 NOTE — Patient Instructions (Signed)
Second Trimester of Pregnancy The second trimester is from week 13 through week 28, months 4 through 6. The second trimester is often a time when you feel your best. Your body has also adjusted to being pregnant, and you begin to feel better physically. Usually, morning sickness has lessened or quit completely, you may have more energy, and you may have an increase in appetite. The second trimester is also a time when the fetus is growing rapidly. At the end of the sixth month, the fetus is about 9 inches long and weighs about 1 pounds. You will likely begin to feel the baby move (quickening) between 18 and 20 weeks of the pregnancy. BODY CHANGES Your body goes through many changes during pregnancy. The changes vary from woman to woman.   Your weight will continue to increase. You will notice your lower abdomen bulging out.  You may begin to get stretch marks on your hips, abdomen, and breasts.  You may develop headaches that can be relieved by medicines approved by your caregiver.  You may urinate more often because the fetus is pressing on your bladder.  You may develop or continue to have heartburn as a result of your pregnancy.  You may develop constipation because certain hormones are causing the muscles that push waste through your intestines to slow down.  You may develop hemorrhoids or swollen, bulging veins (varicose veins).  You may have back pain because of the weight gain and pregnancy hormones relaxing your joints between the bones in your pelvis and as a result of a shift in weight and the muscles that support your balance.  Your breasts will continue to grow and be tender.  Your gums may bleed and may be sensitive to brushing and flossing.  Dark spots or blotches (chloasma, mask of pregnancy) may develop on your face. This will likely fade after the baby is born.  A dark line from your belly button to the pubic area (linea nigra) may appear. This will likely fade after the  baby is born. WHAT TO EXPECT AT YOUR PRENATAL VISITS During a routine prenatal visit:  You will be weighed to make sure you and the fetus are growing normally.  Your blood pressure will be taken.  Your abdomen will be measured to track your baby's growth.  The fetal heartbeat will be listened to.  Any test results from the previous visit will be discussed. Your caregiver may ask you:  How you are feeling.  If you are feeling the baby move.  If you have had any abnormal symptoms, such as leaking fluid, bleeding, severe headaches, or abdominal cramping.  If you have any questions. Other tests that may be performed during your second trimester include:  Blood tests that check for:  Low iron levels (anemia).  Gestational diabetes (between 24 and 28 weeks).  Rh antibodies.  Urine tests to check for infections, diabetes, or protein in the urine.  An ultrasound to confirm the proper growth and development of the baby.  An amniocentesis to check for possible genetic problems.  Fetal screens for spina bifida and Down syndrome. HOME CARE INSTRUCTIONS   Avoid all smoking, herbs, alcohol, and unprescribed drugs. These chemicals affect the formation and growth of the baby.  Follow your caregiver's instructions regarding medicine use. There are medicines that are either safe or unsafe to take during pregnancy.  Exercise only as directed by your caregiver. Experiencing uterine cramps is a good sign to stop exercising.  Continue to eat regular,   healthy meals.  Wear a good support bra for breast tenderness.  Do not use hot tubs, steam rooms, or saunas.  Wear your seat belt at all times when driving.  Avoid raw meat, uncooked cheese, cat litter boxes, and soil used by cats. These carry germs that can cause birth defects in the baby.  Take your prenatal vitamins.  Try taking a stool softener (if your caregiver approves) if you develop constipation. Eat more high-fiber foods,  such as fresh vegetables or fruit and whole grains. Drink plenty of fluids to keep your urine clear or pale yellow.  Take warm sitz baths to soothe any pain or discomfort caused by hemorrhoids. Use hemorrhoid cream if your caregiver approves.  If you develop varicose veins, wear support hose. Elevate your feet for 15 minutes, 3 4 times a day. Limit salt in your diet.  Avoid heavy lifting, wear low heel shoes, and practice good posture.  Rest with your legs elevated if you have leg cramps or low back pain.  Visit your dentist if you have not gone yet during your pregnancy. Use a soft toothbrush to brush your teeth and be gentle when you floss.  A sexual relationship may be continued unless your caregiver directs you otherwise.  Continue to go to all your prenatal visits as directed by your caregiver. SEEK MEDICAL CARE IF:   You have dizziness.  You have mild pelvic cramps, pelvic pressure, or nagging pain in the abdominal area.  You have persistent nausea, vomiting, or diarrhea.  You have a bad smelling vaginal discharge.  You have pain with urination. SEEK IMMEDIATE MEDICAL CARE IF:   You have a fever.  You are leaking fluid from your vagina.  You have spotting or bleeding from your vagina.  You have severe abdominal cramping or pain.  You have rapid weight gain or loss.  You have shortness of breath with chest pain.  You notice sudden or extreme swelling of your face, hands, ankles, feet, or legs.  You have not felt your baby move in over an hour.  You have severe headaches that do not go away with medicine.  You have vision changes. Document Released: 08/30/2001 Document Revised: 05/08/2013 Document Reviewed: 11/06/2012 [employer] Patient Information 2014 [employer].  

## 2013-10-30 NOTE — Progress Notes (Signed)
No FM, no lof, no vb, no ctx  Kelli Bean is a 28 y.o. G4P0030 at 8266w4d presents for ROB  Discussed with Patient:  - Reviewed genetics screen (Quad screen - Patient plans on breast feeding. - Routine precautions discussed (depression, infection s/s).   Patient provided with all pertinent phone numbers for emergencies. - RTC for any VB, regular, painful cramps/ctxs occurring at a rate of >2/10 min, fever (100.5 or higher), n/v/d, any pain that is unresolving or worsening. - RTC in 4 weeks for next appt.  Problems: Patient Active Problem List   Diagnosis Date Noted  . 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 Supervision of normal pregnancy in first trimester 08/09/2013    To Do:   [ ]  Vaccines: Flu: recd Tdap:  [ ]  BCM: undecided  Edu: [x ] PTL precautions; [ ]  BF class; [ ]  childbirth class; [ ]   BF counseling

## 2013-10-30 NOTE — Progress Notes (Signed)
Pulse: 64

## 2013-11-01 ENCOUNTER — Encounter: Payer: Self-pay | Admitting: Family Medicine

## 2013-11-27 ENCOUNTER — Encounter: Payer: Medicaid Other | Admitting: Advanced Practice Midwife

## 2013-12-11 ENCOUNTER — Ambulatory Visit (INDEPENDENT_AMBULATORY_CARE_PROVIDER_SITE_OTHER): Payer: Medicaid Other | Admitting: Family

## 2013-12-11 VITALS — BP 105/69 | Temp 98.2°F | Wt 131.8 lb

## 2013-12-11 DIAGNOSIS — Z3491 Encounter for supervision of normal pregnancy, unspecified, first trimester: Secondary | ICD-10-CM

## 2013-12-11 DIAGNOSIS — Z3480 Encounter for supervision of other normal pregnancy, unspecified trimester: Secondary | ICD-10-CM

## 2013-12-11 DIAGNOSIS — O2620 Pregnancy care for patient with recurrent pregnancy loss, unspecified trimester: Secondary | ICD-10-CM

## 2013-12-11 LAB — POCT URINALYSIS DIP (DEVICE)
BILIRUBIN URINE: NEGATIVE
Glucose, UA: NEGATIVE mg/dL
Hgb urine dipstick: NEGATIVE
KETONES UR: NEGATIVE mg/dL
LEUKOCYTES UA: NEGATIVE
Nitrite: NEGATIVE
PH: 6 (ref 5.0–8.0)
PROTEIN: NEGATIVE mg/dL
SPECIFIC GRAVITY, URINE: 1.01 (ref 1.005–1.030)
Urobilinogen, UA: 0.2 mg/dL (ref 0.0–1.0)

## 2013-12-11 NOTE — Progress Notes (Signed)
Reviewed anatomy ultrasound, questionable marginal placenta, no report of bleeding > schedule follow-up, advised to abstain from intercourse.  No questions or concerns.  Urine results not available at discharge.

## 2013-12-11 NOTE — Progress Notes (Signed)
P = 76 

## 2013-12-18 ENCOUNTER — Ambulatory Visit (HOSPITAL_COMMUNITY)
Admission: RE | Admit: 2013-12-18 | Discharge: 2013-12-18 | Disposition: A | Discharge status: Home / Self Care | Payer: Medicaid Other | Source: Ambulatory Visit | Attending: Family | Admitting: Family

## 2013-12-18 DIAGNOSIS — Z3480 Encounter for supervision of other normal pregnancy, unspecified trimester: Secondary | ICD-10-CM | POA: Insufficient documentation

## 2013-12-18 DIAGNOSIS — Z3491 Encounter for supervision of normal pregnancy, unspecified, first trimester: Secondary | ICD-10-CM

## 2013-12-23 ENCOUNTER — Telehealth: Payer: Self-pay | Admitting: Family

## 2013-12-23 ENCOUNTER — Encounter: Payer: Self-pay | Admitting: Family

## 2013-12-23 NOTE — Telephone Encounter (Signed)
Called and left message with patient to call office.  Need to inform patient that increase fluid around the baby and will need to screen for GDM within the next week.  Also will begin fetal monitoring of baby starting at 32 weeks (NST 2x/week).

## 2013-12-25 ENCOUNTER — Encounter: Payer: Self-pay | Admitting: Family

## 2013-12-25 NOTE — Progress Notes (Signed)
Patient came to window and [facility]. Patient needed an appointment to come in and get a 1 hr gtt. Mayra NeerKelly Rassette RNC-MNN, MHA, MBA spoke with patient and gave her information as of why she needed this test done. Patient made an appointment to come in on 12/26/2013 to get test done.

## 2013-12-26 ENCOUNTER — Other Ambulatory Visit: Payer: Medicaid Other

## 2013-12-26 DIAGNOSIS — Z3480 Encounter for supervision of other normal pregnancy, unspecified trimester: Secondary | ICD-10-CM

## 2013-12-26 DIAGNOSIS — Z3491 Encounter for supervision of normal pregnancy, unspecified, first trimester: Secondary | ICD-10-CM

## 2013-12-27 LAB — GLUCOSE TOLERANCE, 1 HOUR (50G) W/O FASTING: Glucose, 1 Hour GTT: 102 mg/dL (ref 70–140)

## 2013-12-31 ENCOUNTER — Encounter: Payer: Self-pay | Admitting: Family

## 2014-01-08 ENCOUNTER — Encounter: Payer: Self-pay | Admitting: Advanced Practice Midwife

## 2014-01-08 ENCOUNTER — Ambulatory Visit (INDEPENDENT_AMBULATORY_CARE_PROVIDER_SITE_OTHER): Payer: Medicaid Other | Admitting: Advanced Practice Midwife

## 2014-01-08 VITALS — BP 107/67 | HR 78 | Temp 98.5°F | Wt 137.9 lb

## 2014-01-08 DIAGNOSIS — O409XX0 Polyhydramnios, unspecified trimester, not applicable or unspecified: Secondary | ICD-10-CM

## 2014-01-08 DIAGNOSIS — Z3491 Encounter for supervision of normal pregnancy, unspecified, first trimester: Secondary | ICD-10-CM

## 2014-01-08 DIAGNOSIS — Z3480 Encounter for supervision of other normal pregnancy, unspecified trimester: Secondary | ICD-10-CM

## 2014-01-08 DIAGNOSIS — O2620 Pregnancy care for patient with recurrent pregnancy loss, unspecified trimester: Secondary | ICD-10-CM

## 2014-01-08 LAB — POCT URINALYSIS DIP (DEVICE)
BILIRUBIN URINE: NEGATIVE
Glucose, UA: NEGATIVE mg/dL
Hgb urine dipstick: NEGATIVE
KETONES UR: NEGATIVE mg/dL
LEUKOCYTES UA: NEGATIVE
Nitrite: NEGATIVE
PH: 7 (ref 5.0–8.0)
PROTEIN: NEGATIVE mg/dL
SPECIFIC GRAVITY, URINE: 1.02 (ref 1.005–1.030)
Urobilinogen, UA: 0.2 mg/dL (ref 0.0–1.0)

## 2014-01-08 NOTE — Progress Notes (Signed)
Doing well. Has growth US scheduled.

## 2014-01-08 NOTE — Patient Instructions (Signed)
Third Trimester of Pregnancy  The third trimester is from week 29 through week 42, months 7 through 9. The third trimester is a time when the fetus is growing rapidly. At the end of the ninth month, the fetus is about 20 inches in length and weighs 6 10 pounds.   BODY CHANGES  Your body goes through many changes during pregnancy. The changes vary from woman to woman.    Your weight will continue to increase. You can expect to gain 25 35 pounds (11 16 kg) by the end of the pregnancy.   You may begin to get stretch marks on your hips, abdomen, and breasts.   You may urinate more often because the fetus is moving lower into your pelvis and pressing on your bladder.   You may develop or continue to have heartburn as a result of your pregnancy.   You may develop constipation because certain hormones are causing the muscles that push waste through your intestines to slow down.   You may develop hemorrhoids or swollen, bulging veins (varicose veins).   You may have pelvic pain because of the weight gain and pregnancy hormones relaxing your joints between the bones in your pelvis. Back aches may result from over exertion of the muscles supporting your posture.   Your breasts will continue to grow and be tender. A yellow discharge may leak from your breasts called colostrum.   Your belly button may stick out.   You may feel short of breath because of your expanding uterus.   You may notice the fetus "dropping," or moving lower in your abdomen.   You may have a bloody mucus discharge. This usually occurs a few days to a week before labor begins.   Your cervix becomes thin and soft (effaced) near your due date.  WHAT TO EXPECT AT YOUR PRENATAL EXAMS   You will have prenatal exams every 2 weeks until week 36. Then, you will have weekly prenatal exams. During a routine prenatal visit:   You will be weighed to make sure you and the fetus are growing normally.   Your blood pressure is taken.   Your abdomen will be  measured to track your baby's growth.   The fetal heartbeat will be listened to.   Any test results from the previous visit will be discussed.   You may have a cervical check near your due date to see if you have effaced.  At around 36 weeks, your caregiver will check your cervix. At the same time, your caregiver will also perform a test on the secretions of the vaginal tissue. This test is to determine if a type of bacteria, Group B streptococcus, is present. Your caregiver will explain this further.  Your caregiver may ask you:   What your birth plan is.   How you are feeling.   If you are feeling the baby move.   If you have had any abnormal symptoms, such as leaking fluid, bleeding, severe headaches, or abdominal cramping.   If you have any questions.  Other tests or screenings that may be performed during your third trimester include:   Blood tests that check for low iron levels (anemia).   Fetal testing to check the health, activity level, and growth of the fetus. Testing is done if you have certain medical conditions or if there are problems during the pregnancy.  FALSE LABOR  You may feel small, irregular contractions that eventually go away. These are called Braxton Hicks contractions, or   false labor. Contractions may last for hours, days, or even weeks before true labor sets in. If contractions come at regular intervals, intensify, or become painful, it is best to be seen by your caregiver.   SIGNS OF LABOR    Menstrual-like cramps.   Contractions that are 5 minutes apart or less.   Contractions that start on the top of the uterus and spread down to the lower abdomen and back.   A sense of increased pelvic pressure or back pain.   A watery or bloody mucus discharge that comes from the vagina.  If you have any of these signs before the 37th week of pregnancy, call your caregiver right away. You need to go to the hospital to get checked immediately.  HOME CARE INSTRUCTIONS    Avoid all  smoking, herbs, alcohol, and unprescribed drugs. These chemicals affect the formation and growth of the baby.   Follow your caregiver's instructions regarding medicine use. There are medicines that are either safe or unsafe to take during pregnancy.   Exercise only as directed by your caregiver. Experiencing uterine cramps is a good sign to stop exercising.   Continue to eat regular, healthy meals.   Wear a good support bra for breast tenderness.   Do not use hot tubs, steam rooms, or saunas.   Wear your seat belt at all times when driving.   Avoid raw meat, uncooked cheese, cat litter boxes, and soil used by cats. These carry germs that can cause birth defects in the baby.   Take your prenatal vitamins.   Try taking a stool softener (if your caregiver approves) if you develop constipation. Eat more high-fiber foods, such as fresh vegetables or fruit and whole grains. Drink plenty of fluids to keep your urine clear or pale yellow.   Take warm sitz baths to soothe any pain or discomfort caused by hemorrhoids. Use hemorrhoid cream if your caregiver approves.   If you develop varicose veins, wear support hose. Elevate your feet for 15 minutes, 3 4 times a day. Limit salt in your diet.   Avoid heavy lifting, wear low heal shoes, and practice good posture.   Rest a lot with your legs elevated if you have leg cramps or low back pain.   Visit your dentist if you have not gone during your pregnancy. Use a soft toothbrush to brush your teeth and be gentle when you floss.   A sexual relationship may be continued unless your caregiver directs you otherwise.   Do not travel far distances unless it is absolutely necessary and only with the approval of your caregiver.   Take prenatal classes to understand, practice, and ask questions about the labor and delivery.   Make a trial run to the hospital.   Pack your hospital bag.   Prepare the baby's nursery.   Continue to go to all your prenatal visits as directed  by your caregiver.  SEEK MEDICAL CARE IF:   You are unsure if you are in labor or if your water has broken.   You have dizziness.   You have mild pelvic cramps, pelvic pressure, or nagging pain in your abdominal area.   You have persistent nausea, vomiting, or diarrhea.   You have a bad smelling vaginal discharge.   You have pain with urination.  SEEK IMMEDIATE MEDICAL CARE IF:    You have a fever.   You are leaking fluid from your vagina.   You have spotting or bleeding from your vagina.     You have severe abdominal cramping or pain.   You have rapid weight loss or gain.   You have shortness of breath with chest pain.   You notice sudden or extreme swelling of your face, hands, ankles, feet, or legs.   You have not felt your baby move in over an hour.   You have severe headaches that do not go away with medicine.   You have vision changes.  Document Released: 08/30/2001 Document Revised: 05/08/2013 Document Reviewed: 11/06/2012  [employer] Patient Information 2014 [employer].

## 2014-01-14 ENCOUNTER — Ambulatory Visit (HOSPITAL_COMMUNITY)
Admission: RE | Admit: 2014-01-14 | Discharge: 2014-01-14 | Disposition: A | Discharge status: Home / Self Care | Payer: Medicaid Other | Source: Ambulatory Visit | Attending: Advanced Practice Midwife | Admitting: Advanced Practice Midwife

## 2014-01-14 DIAGNOSIS — Z3689 Encounter for other specified antenatal screening: Secondary | ICD-10-CM | POA: Insufficient documentation

## 2014-01-14 DIAGNOSIS — O409XX0 Polyhydramnios, unspecified trimester, not applicable or unspecified: Secondary | ICD-10-CM

## 2014-01-22 ENCOUNTER — Ambulatory Visit (INDEPENDENT_AMBULATORY_CARE_PROVIDER_SITE_OTHER): Payer: Medicaid Other | Admitting: Obstetrics and Gynecology

## 2014-01-22 ENCOUNTER — Encounter: Payer: Self-pay | Admitting: Obstetrics and Gynecology

## 2014-01-22 VITALS — BP 100/65 | HR 85 | Temp 97.7°F | Wt 140.4 lb

## 2014-01-22 DIAGNOSIS — O409XX0 Polyhydramnios, unspecified trimester, not applicable or unspecified: Secondary | ICD-10-CM

## 2014-01-22 DIAGNOSIS — Z3491 Encounter for supervision of normal pregnancy, unspecified, first trimester: Secondary | ICD-10-CM

## 2014-01-22 LAB — POCT URINALYSIS DIP (DEVICE)
Bilirubin Urine: NEGATIVE
Glucose, UA: NEGATIVE mg/dL
HGB URINE DIPSTICK: NEGATIVE
KETONES UR: NEGATIVE mg/dL
Leukocytes, UA: NEGATIVE
Nitrite: NEGATIVE
Protein, ur: NEGATIVE mg/dL
SPECIFIC GRAVITY, URINE: 1.02 (ref 1.005–1.030)
UROBILINOGEN UA: 0.2 mg/dL (ref 0.0–1.0)
pH: 6 (ref 5.0–8.0)

## 2014-01-22 NOTE — Patient Instructions (Signed)
Third Trimester of Pregnancy  The third trimester is from week 29 through week 42, months 7 through 9. The third trimester is a time when the fetus is growing rapidly. At the end of the ninth month, the fetus is about 20 inches in length and weighs 6 10 pounds.   BODY CHANGES  Your body goes through many changes during pregnancy. The changes vary from woman to woman.    Your weight will continue to increase. You can expect to gain 25 35 pounds (11 16 kg) by the end of the pregnancy.   You may begin to get stretch marks on your hips, abdomen, and breasts.   You may urinate more often because the fetus is moving lower into your pelvis and pressing on your bladder.   You may develop or continue to have heartburn as a result of your pregnancy.   You may develop constipation because certain hormones are causing the muscles that push waste through your intestines to slow down.   You may develop hemorrhoids or swollen, bulging veins (varicose veins).   You may have pelvic pain because of the weight gain and pregnancy hormones relaxing your joints between the bones in your pelvis. Back aches may result from over exertion of the muscles supporting your posture.   Your breasts will continue to grow and be tender. A yellow discharge may leak from your breasts called colostrum.   Your belly button may stick out.   You may feel short of breath because of your expanding uterus.   You may notice the fetus "dropping," or moving lower in your abdomen.   You may have a bloody mucus discharge. This usually occurs a few days to a week before labor begins.   Your cervix becomes thin and soft (effaced) near your due date.  WHAT TO EXPECT AT YOUR PRENATAL EXAMS   You will have prenatal exams every 2 weeks until week 36. Then, you will have weekly prenatal exams. During a routine prenatal visit:   You will be weighed to make sure you and the fetus are growing normally.   Your blood pressure is taken.   Your abdomen will be  measured to track your baby's growth.   The fetal heartbeat will be listened to.   Any test results from the previous visit will be discussed.   You may have a cervical check near your due date to see if you have effaced.  At around 36 weeks, your caregiver will check your cervix. At the same time, your caregiver will also perform a test on the secretions of the vaginal tissue. This test is to determine if a type of bacteria, Group B streptococcus, is present. Your caregiver will explain this further.  Your caregiver may ask you:   What your birth plan is.   How you are feeling.   If you are feeling the baby move.   If you have had any abnormal symptoms, such as leaking fluid, bleeding, severe headaches, or abdominal cramping.   If you have any questions.  Other tests or screenings that may be performed during your third trimester include:   Blood tests that check for low iron levels (anemia).   Fetal testing to check the health, activity level, and growth of the fetus. Testing is done if you have certain medical conditions or if there are problems during the pregnancy.  FALSE LABOR  You may feel small, irregular contractions that eventually go away. These are called Braxton Hicks contractions, or   false labor. Contractions may last for hours, days, or even weeks before true labor sets in. If contractions come at regular intervals, intensify, or become painful, it is best to be seen by your caregiver.   SIGNS OF LABOR    Menstrual-like cramps.   Contractions that are 5 minutes apart or less.   Contractions that start on the top of the uterus and spread down to the lower abdomen and back.   A sense of increased pelvic pressure or back pain.   A watery or bloody mucus discharge that comes from the vagina.  If you have any of these signs before the 37th week of pregnancy, call your caregiver right away. You need to go to the hospital to get checked immediately.  HOME CARE INSTRUCTIONS    Avoid all  smoking, herbs, alcohol, and unprescribed drugs. These chemicals affect the formation and growth of the baby.   Follow your caregiver's instructions regarding medicine use. There are medicines that are either safe or unsafe to take during pregnancy.   Exercise only as directed by your caregiver. Experiencing uterine cramps is a good sign to stop exercising.   Continue to eat regular, healthy meals.   Wear a good support bra for breast tenderness.   Do not use hot tubs, steam rooms, or saunas.   Wear your seat belt at all times when driving.   Avoid raw meat, uncooked cheese, cat litter boxes, and soil used by cats. These carry germs that can cause birth defects in the baby.   Take your prenatal vitamins.   Try taking a stool softener (if your caregiver approves) if you develop constipation. Eat more high-fiber foods, such as fresh vegetables or fruit and whole grains. Drink plenty of fluids to keep your urine clear or pale yellow.   Take warm sitz baths to soothe any pain or discomfort caused by hemorrhoids. Use hemorrhoid cream if your caregiver approves.   If you develop varicose veins, wear support hose. Elevate your feet for 15 minutes, 3 4 times a day. Limit salt in your diet.   Avoid heavy lifting, wear low heal shoes, and practice good posture.   Rest a lot with your legs elevated if you have leg cramps or low back pain.   Visit your dentist if you have not gone during your pregnancy. Use a soft toothbrush to brush your teeth and be gentle when you floss.   A sexual relationship may be continued unless your caregiver directs you otherwise.   Do not travel far distances unless it is absolutely necessary and only with the approval of your caregiver.   Take prenatal classes to understand, practice, and ask questions about the labor and delivery.   Make a trial run to the hospital.   Pack your hospital bag.   Prepare the baby's nursery.   Continue to go to all your prenatal visits as directed  by your caregiver.  SEEK MEDICAL CARE IF:   You are unsure if you are in labor or if your water has broken.   You have dizziness.   You have mild pelvic cramps, pelvic pressure, or nagging pain in your abdominal area.   You have persistent nausea, vomiting, or diarrhea.   You have a bad smelling vaginal discharge.   You have pain with urination.  SEEK IMMEDIATE MEDICAL CARE IF:    You have a fever.   You are leaking fluid from your vagina.   You have spotting or bleeding from your vagina.     You have severe abdominal cramping or pain.   You have rapid weight loss or gain.   You have shortness of breath with chest pain.   You notice sudden or extreme swelling of your face, hands, ankles, feet, or legs.   You have not felt your baby move in over an hour.   You have severe headaches that do not go away with medicine.   You have vision changes.  Document Released: 08/30/2001 Document Revised: 05/08/2013 Document Reviewed: 11/06/2012  [employer] Patient Information 2014 [employer].

## 2014-01-22 NOTE — Progress Notes (Signed)
Mild polyhydramnios 2 wks ago with plan to f/u @ 4 wks> schedule. Glucola was 102. Good FM.  Works [employer]. Doing well.

## 2014-02-05 ENCOUNTER — Ambulatory Visit (HOSPITAL_COMMUNITY)
Admission: RE | Admit: 2014-02-05 | Discharge: 2014-02-05 | Disposition: A | Discharge status: Home / Self Care | Payer: Medicaid Other | Source: Ambulatory Visit | Attending: Obstetrics and Gynecology | Admitting: Obstetrics and Gynecology

## 2014-02-05 ENCOUNTER — Inpatient Hospital Stay (HOSPITAL_COMMUNITY)
Admission: AD | Admit: 2014-02-05 | Discharge: 2014-02-05 | Disposition: A | Discharge status: Home / Self Care | Payer: Medicaid Other | Source: Ambulatory Visit | Attending: Obstetrics & Gynecology | Admitting: Obstetrics & Gynecology

## 2014-02-05 ENCOUNTER — Encounter (HOSPITAL_COMMUNITY): Payer: Self-pay

## 2014-02-05 ENCOUNTER — Other Ambulatory Visit: Payer: Self-pay | Admitting: Obstetrics and Gynecology

## 2014-02-05 DIAGNOSIS — Z3491 Encounter for supervision of normal pregnancy, unspecified, first trimester: Secondary | ICD-10-CM

## 2014-02-05 DIAGNOSIS — O4700 False labor before 37 completed weeks of gestation, unspecified trimester: Secondary | ICD-10-CM | POA: Insufficient documentation

## 2014-02-05 DIAGNOSIS — O409XX0 Polyhydramnios, unspecified trimester, not applicable or unspecified: Secondary | ICD-10-CM

## 2014-02-05 DIAGNOSIS — Z3689 Encounter for other specified antenatal screening: Secondary | ICD-10-CM | POA: Insufficient documentation

## 2014-02-05 NOTE — MAU Provider Note (Signed)
S:  Pt sent from [facility] for BPP of 6/8 for absent breathing.  BPP done for polyhydramnios.   Denies any complaints No contractions or braxton-hicks, no lof, vb.   +FM and normal   O:   Filed Vitals:   02/05/14 1210  BP: 123/75  Pulse: 64  Temp:   Resp:     Gen: NAD ABD: soft, gravid SVE: 0/thick/high FHT: 130s, mod var, +accels, no decels.     A/P 28 yo G4P0030 @ 4016w4d with polyhydramnios who was sent for NST after BPP of 6/8  NST- cat I tracing.  8/10- overall good reassuring fetal status.  Toco: with irritability on the monitor, not felt by pt. SVE 0/thick/high  D/c to home. F/u as scheduled.    Vale HavenKeli L Lateef Juncaj, MD

## 2014-02-05 NOTE — Progress Notes (Signed)
Notified of pt arrival in MAU and current FHR tracing with frequent contractions. Will PO hydrate and update with result

## 2014-02-05 NOTE — MAU Note (Signed)
Had US today to check fluid level, hx of poly..  Fluid level is still up, not seeing breathing movements- sent here for NST

## 2014-02-05 NOTE — Discharge Instructions (Signed)
Polyhydramnios When a woman becomes pregnant, a sac is formed around the fertilized egg (embryo) and later the growing baby (fetus). This sac is called the amniotic sac. The amniotic sac is filled with fluid. It gets bigger as the pregnancy grows. When there is too much fluid in the sac, it is called polyhydramnios. All babies born with polyhydramnios should be looked at for congenital abnormalities. The amniotic fluid cushions and protects the baby. It also provides the baby with fluids and is crucial to normal development. Your baby breathes this fluid into its lungs and swallows it. This helps promote the healthy growth of the lungs and gastrointestinal tract. Amniotic fluid also helps the baby move around, helping with the normal development of muscle and bone.  CAUSES   Diabetes mellitus.  Downs Syndrome, fetal abnormalities of the intestinal tract and anencephaly (the fetus has no brain) can prevent the fetus from swallowing the amniotic fluid.  One twins passes (transfuses) their blood into the other twin (twin-twin transfusion syndrome).  Medical illness of the mother or heart.  Kidney disease.  Tumor (chorioangioma) of the placenta. SYMPTOMS   The uterus enlarges beyond the size it should be for that particular time of the pregnancy.  The mother may feel more pressure and discomfort than should be expected.  The mother may notice a quick and unexpected enlargement of her stomach. DIAGNOSIS   Your health care provider notices your uterus is beyond the size that is consistent with the time of the pregnancy when he or she measures you.  An ultrasound is then used (abdominally or vaginally) to see if there are twins or more, measure the growth of the baby, looks for birth defects and measures the amount of fluid in the amniotic sac.  Amniotic Fluid Index (AFI). AFI measures the amount of fluid in the amniotic sac in four different areas. If there is more than 24 centimeters, you  have polyhydramnios. TREATMENT   Removing some fluid from the amniotic sac.  Take medications that lower the fluids in your body.  Stop using salt or salty foods because it causes you to keep fluid in your body (retention).  If your health care provider thinks you have polyhydramnios, you will likely need more testing. You will be watched for the rest of your pregnancy. HOME CARE INSTRUCTIONS   Keep all your prenatal appointments. Follow your health care provider's recommendations.  Do not eat a lot of salt and salty foods.  If you have diabetes, keep in it control.  If you have heart or kidney disease, treat the disease as advised by your health care provider. SEEK MEDICAL CARE IF:   You think your uterus has grown too fast in a short period of time.  You feel a great amount of pressure in your lower belly (pelvis) and are more uncomfortable than expected. SEEK IMMEDIATE MEDICAL CARE IF:   You have a gush of fluid or are leaking fluid from your vagina.  You stop feeling the baby move.  You do not feel the baby kicking as much as usual.  You have a hard time keeping your diabetes under control.  You are having problems with your heart or kidney disease. Document Released: 11/26/2002 Document Revised: 06/26/2013 Document Reviewed: 04/04/2013 [facility] Patient Information 2014 [place], [place].  Fetal Biophysical Profile This is a test that measures five different variables of the fetus: Heart rate, breathing movement, total movement of the baby, fetal muscle tone, the amount of amniotic fluid, and the heart  rate activity of the fetus. The five variables are measured individually and contribute either a 2 or a 0 to the overall scoring of the test. The measurements are as follows:  Fetal heart rate activity. This is measured and scored in the same way as a non-stress test. The fetal heart rate is considered reactive when there are movement-associated fetal heart rate increases of  at least 15 beats per minute above baseline, and 15 seconds in duration over a 20-minute period. A score of 2 is given for reactivity, and a score of 0 indicates that the fetal heart rate is non-reactive.  Fetal breathing movements. This is scored based on fetal breathing movements and indicate fetal well-being. Their absence may indicate a low oxygen level for the fetus. Fetal breathing increases in frequency and uniformity after the 36th week of pregnancy. To earn a score of 2, the fetus must have at least one episode of fetal breathing lasting at least 60 seconds within a 30-minute observation. Absence of this breathing is scored a 0 on the BPP.  Fetal body movements. Fetal activity is a reflection of brain integrity and function. The presence of at least three episodes of fetal movements within a 30-minute period is given a score of 2. A score of 0 is given with two or less movements in this time period. Fetal activity is highest 1 to 3 hours after the mother has eaten a meal.  Fetal tone. In the uterus, the fetus is normally in a position of flexion. This means the head is bent down towards the knees. The fetus also stretches, rolls, and moves in the uterus. The arms, legs, trunk, and head may be flexed and extended. A score of 2 is earned when there is at least one episode of active extension with return flexion. A score of 0 is given for slow extension with a return to only partial flexion. Fetal movement not followed by return to flexion, limbs or spine in extension, and an open fetal hand score 0.  Amniotic fluid volume. Amniotic fluid volume has been demonstrated to be a good method of predicting fetal distress. Too little amniotic fluid has been associated with fetal abnormalities, slow uterine growth, and over due pregnancy. A score of 2 is given for this when there is at least one pocket of amniotic fluid that measures 1 cm in a specific area. A score of 0 indicates either that fluid is absent  in most areas of the uterine cavity or that the largest pocket of fluid measures less than 1 cm. PREPARATION FOR TEST No preparation or fasting is necessary. NORMAL FINDINGS  A score of 8-10 points (if amniotic fluid volume is adequate).  Possible critical values: Less than 4 may necessitate immediate delivery of fetus. Ranges for normal findings may vary among different laboratories and hospitals. You should always check with your doctor after having lab work or other tests done to discuss the meaning of your test results and whether your values are considered within normal limits. MEANING OF TEST  Your caregiver will go over the test results with you and discuss the importance and meaning of your results, as well as treatment options and the need for additional tests if necessary. OBTAINING THE TEST RESULTS  It is your responsibility to obtain your test results. Ask the lab or department performing the test when and how you will get your results. Document Released: 01/06/2005 Document Revised: 11/28/2011 Document Reviewed: 08/15/2008 [facility]ExitCare Patient Information 2014 [place]ExitCare, [place]LLC.

## 2014-02-12 ENCOUNTER — Encounter: Payer: Medicaid Other | Admitting: Family Medicine

## 2014-02-19 ENCOUNTER — Ambulatory Visit (HOSPITAL_COMMUNITY)
Admission: RE | Admit: 2014-02-19 | Discharge: 2014-02-19 | Disposition: A | Discharge status: Home / Self Care | Payer: Medicaid Other | Source: Ambulatory Visit | Attending: Family Medicine | Admitting: Family Medicine

## 2014-02-19 ENCOUNTER — Encounter: Payer: Self-pay | Admitting: Family Medicine

## 2014-02-19 ENCOUNTER — Ambulatory Visit (INDEPENDENT_AMBULATORY_CARE_PROVIDER_SITE_OTHER): Payer: Medicaid Other | Admitting: Family Medicine

## 2014-02-19 VITALS — BP 114/73 | HR 78 | Wt 146.4 lb

## 2014-02-19 DIAGNOSIS — Z3491 Encounter for supervision of normal pregnancy, unspecified, first trimester: Secondary | ICD-10-CM

## 2014-02-19 DIAGNOSIS — O368390 Maternal care for abnormalities of the fetal heart rate or rhythm, unspecified trimester, not applicable or unspecified: Secondary | ICD-10-CM | POA: Insufficient documentation

## 2014-02-19 DIAGNOSIS — O409XX0 Polyhydramnios, unspecified trimester, not applicable or unspecified: Secondary | ICD-10-CM

## 2014-02-19 DIAGNOSIS — Z3480 Encounter for supervision of other normal pregnancy, unspecified trimester: Secondary | ICD-10-CM

## 2014-02-19 DIAGNOSIS — Z3689 Encounter for other specified antenatal screening: Secondary | ICD-10-CM | POA: Insufficient documentation

## 2014-02-19 LAB — POCT URINALYSIS DIP (DEVICE)
Bilirubin Urine: NEGATIVE
GLUCOSE, UA: NEGATIVE mg/dL
HGB URINE DIPSTICK: NEGATIVE
Ketones, ur: NEGATIVE mg/dL
Leukocytes, UA: NEGATIVE
NITRITE: NEGATIVE
PH: 7.5 (ref 5.0–8.0)
Protein, ur: NEGATIVE mg/dL
SPECIFIC GRAVITY, URINE: 1.015 (ref 1.005–1.030)
Urobilinogen, UA: 0.2 mg/dL (ref 0.0–1.0)

## 2014-02-19 LAB — OB RESULTS CONSOLE GBS: GBS: POSITIVE

## 2014-02-19 NOTE — Progress Notes (Signed)
+  FM, no lof, no vb, no ctx Polyhdramnios - repeat US with MFM, start twice weekly testing now  Kelli Bean is a 28 y.o. G4P0030 at [redacted]w[redacted]d by L=7 here for ROB visit.  Discussed with Patient:  -Plans to breast feed.  All questions answered. -Continue prenatal vitamins. -Reviewed fetal kick counts Pt to perform daily at a time when the baby is active, lie laterally with both hands on belly in quiet room and count all movements (hiccups, shoulder rolls, obvious kicks, etc); pt is to report to clinic L&D for less than 10 movements felt in a one hour time period-pt told as soon as she counts 10 movements the count is complete.  - Routine precautions discussed (depression, infection s/s).   Patient provided with all pertinent phone numbers for emergencies. - RTC for any VB, regular, painful cramps/ctxs occurring at a rate of >2/10 min, fever (100.5 or higher), n/v/d, any pain that is unresolving or worsening, LOF, decreased fetal movement, CP, SOB, edema - RTC for twice weekly testing - Did GBS swabs today and will f/u results and call if abnormal.  Contact#:  Pt has no drug allergies, Bean will get PCN if GBS+ OR will get sensitivities on GBS swab as pt is PCN-allergic.  Problems: Patient Active Problem List   Diagnosis Date Noted  . Polyhydramnios, antepartum 12/23/2013  . 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 Supervision of normal pregnancy in first trimester 08/09/2013    To Do: 1. NST Today - having some variable decels, lots of uterine irritability. Will perform BPP  [ ]  Vaccines: recd [x ] BCM: mirena [ ]  Readiness: baby has a place to sleep, car seat, other baby necessities.  Edu: [x ] PTL precautions; [ ]  BF class; [ ]  childbirth class; [ ]   BF counseling;

## 2014-02-19 NOTE — Patient Instructions (Signed)
Third Trimester of Pregnancy  The third trimester is from week 29 through week 42, months 7 through 9. The third trimester is a time when the fetus is growing rapidly. At the end of the ninth month, the fetus is about 20 inches in length and weighs 6 10 pounds.   BODY CHANGES  Your body goes through many changes during pregnancy. The changes vary from woman to woman.    Your weight will continue to increase. You can expect to gain 25 35 pounds (11 16 kg) by the end of the pregnancy.   You may begin to get stretch marks on your hips, abdomen, and breasts.   You may urinate more often because the fetus is moving lower into your pelvis and pressing on your bladder.   You may develop or continue to have heartburn as a result of your pregnancy.   You may develop constipation because certain hormones are causing the muscles that push waste through your intestines to slow down.   You may develop hemorrhoids or swollen, bulging veins (varicose veins).   You may have pelvic pain because of the weight gain and pregnancy hormones relaxing your joints between the bones in your pelvis. Back aches may result from over exertion of the muscles supporting your posture.   Your breasts will continue to grow and be tender. A yellow discharge may leak from your breasts called colostrum.   Your belly button may stick out.   You may feel short of breath because of your expanding uterus.   You may notice the fetus "dropping," or moving lower in your abdomen.   You may have a bloody mucus discharge. This usually occurs a few days to a week before labor begins.   Your cervix becomes thin and soft (effaced) near your due date.  WHAT TO EXPECT AT YOUR PRENATAL EXAMS   You will have prenatal exams every 2 weeks until week 36. Then, you will have weekly prenatal exams. During a routine prenatal visit:   You will be weighed to make sure you and the fetus are growing normally.   Your blood pressure is taken.   Your abdomen will be  measured to track your baby's growth.   The fetal heartbeat will be listened to.   Any test results from the previous visit will be discussed.   You may have a cervical check near your due date to see if you have effaced.  At around 36 weeks, your caregiver will check your cervix. At the same time, your caregiver will also perform a test on the secretions of the vaginal tissue. This test is to determine if a type of bacteria, Group B streptococcus, is present. Your caregiver will explain this further.  Your caregiver may ask you:   What your birth plan is.   How you are feeling.   If you are feeling the baby move.   If you have had any abnormal symptoms, such as leaking fluid, bleeding, severe headaches, or abdominal cramping.   If you have any questions.  Other tests or screenings that may be performed during your third trimester include:   Blood tests that check for low iron levels (anemia).   Fetal testing to check the health, activity level, and growth of the fetus. Testing is done if you have certain medical conditions or if there are problems during the pregnancy.  FALSE LABOR  You may feel small, irregular contractions that eventually go away. These are called Braxton Hicks contractions, or   false labor. Contractions may last for hours, days, or even weeks before true labor sets in. If contractions come at regular intervals, intensify, or become painful, it is best to be seen by your caregiver.   SIGNS OF LABOR    Menstrual-like cramps.   Contractions that are 5 minutes apart or less.   Contractions that start on the top of the uterus and spread down to the lower abdomen and back.   A sense of increased pelvic pressure or back pain.   A watery or bloody mucus discharge that comes from the vagina.  If you have any of these signs before the 37th week of pregnancy, call your caregiver right away. You need to go to the hospital to get checked immediately.  HOME CARE INSTRUCTIONS    Avoid all  smoking, herbs, alcohol, and unprescribed drugs. These chemicals affect the formation and growth of the baby.   Follow your caregiver's instructions regarding medicine use. There are medicines that are either safe or unsafe to take during pregnancy.   Exercise only as directed by your caregiver. Experiencing uterine cramps is a good sign to stop exercising.   Continue to eat regular, healthy meals.   Wear a good support bra for breast tenderness.   Do not use hot tubs, steam rooms, or saunas.   Wear your seat belt at all times when driving.   Avoid raw meat, uncooked cheese, cat litter boxes, and soil used by cats. These carry germs that can cause birth defects in the baby.   Take your prenatal vitamins.   Try taking a stool softener (if your caregiver approves) if you develop constipation. Eat more high-fiber foods, such as fresh vegetables or fruit and whole grains. Drink plenty of fluids to keep your urine clear or pale yellow.   Take warm sitz baths to soothe any pain or discomfort caused by hemorrhoids. Use hemorrhoid cream if your caregiver approves.   If you develop varicose veins, wear support hose. Elevate your feet for 15 minutes, 3 4 times a day. Limit salt in your diet.   Avoid heavy lifting, wear low heal shoes, and practice good posture.   Rest a lot with your legs elevated if you have leg cramps or low back pain.   Visit your dentist if you have not gone during your pregnancy. Use a soft toothbrush to brush your teeth and be gentle when you floss.   A sexual relationship may be continued unless your caregiver directs you otherwise.   Do not travel far distances unless it is absolutely necessary and only with the approval of your caregiver.   Take prenatal classes to understand, practice, and ask questions about the labor and delivery.   Make a trial run to the hospital.   Pack your hospital bag.   Prepare the baby's nursery.   Continue to go to all your prenatal visits as directed  by your caregiver.  SEEK MEDICAL CARE IF:   You are unsure if you are in labor or if your water has broken.   You have dizziness.   You have mild pelvic cramps, pelvic pressure, or nagging pain in your abdominal area.   You have persistent nausea, vomiting, or diarrhea.   You have a bad smelling vaginal discharge.   You have pain with urination.  SEEK IMMEDIATE MEDICAL CARE IF:    You have a fever.   You are leaking fluid from your vagina.   You have spotting or bleeding from your vagina.     You have severe abdominal cramping or pain.   You have rapid weight loss or gain.   You have shortness of breath with chest pain.   You notice sudden or extreme swelling of your face, hands, ankles, feet, or legs.   You have not felt your baby move in over an hour.   You have severe headaches that do not go away with medicine.   You have vision changes.  Document Released: 08/30/2001 Document Revised: 05/08/2013 Document Reviewed: 11/06/2012  [employer] Patient Information 2014 [employer].

## 2014-02-20 LAB — CULTURE, BETA STREP (GROUP B ONLY)

## 2014-02-20 LAB — GC/CHLAMYDIA PROBE AMP
CT Probe RNA: NEGATIVE
GC PROBE AMP APTIMA: NEGATIVE

## 2014-02-24 ENCOUNTER — Encounter: Payer: Self-pay | Admitting: Family Medicine

## 2014-02-24 ENCOUNTER — Telehealth: Payer: Self-pay | Admitting: Advanced Practice Midwife

## 2014-02-24 ENCOUNTER — Ambulatory Visit (INDEPENDENT_AMBULATORY_CARE_PROVIDER_SITE_OTHER): Payer: Medicaid Other | Admitting: *Deleted

## 2014-02-24 VITALS — BP 105/64 | HR 72 | Temp 98.0°F | Wt 146.5 lb

## 2014-02-24 DIAGNOSIS — O409XX0 Polyhydramnios, unspecified trimester, not applicable or unspecified: Secondary | ICD-10-CM

## 2014-02-24 NOTE — Progress Notes (Signed)
6/8 NST reviewed and reactive

## 2014-02-26 NOTE — Telephone Encounter (Signed)
Opened in error

## 2014-02-27 ENCOUNTER — Encounter: Payer: Self-pay | Admitting: Obstetrics and Gynecology

## 2014-02-27 ENCOUNTER — Ambulatory Visit (INDEPENDENT_AMBULATORY_CARE_PROVIDER_SITE_OTHER): Payer: Medicaid Other | Admitting: Obstetrics and Gynecology

## 2014-02-27 VITALS — BP 116/71 | HR 80 | Temp 97.6°F | Wt 148.2 lb

## 2014-02-27 DIAGNOSIS — O409XX0 Polyhydramnios, unspecified trimester, not applicable or unspecified: Secondary | ICD-10-CM

## 2014-02-27 DIAGNOSIS — Z23 Encounter for immunization: Secondary | ICD-10-CM

## 2014-02-27 LAB — POCT URINALYSIS DIP (DEVICE)
BILIRUBIN URINE: NEGATIVE
GLUCOSE, UA: NEGATIVE mg/dL
Hgb urine dipstick: NEGATIVE
KETONES UR: NEGATIVE mg/dL
LEUKOCYTES UA: NEGATIVE
Nitrite: NEGATIVE
Protein, ur: NEGATIVE mg/dL
SPECIFIC GRAVITY, URINE: 1.02 (ref 1.005–1.030)
Urobilinogen, UA: 0.2 mg/dL (ref 0.0–1.0)
pH: 7 (ref 5.0–8.0)

## 2014-02-27 LAB — US OB FOLLOW UP

## 2014-02-27 MED ORDER — TETANUS-DIPHTH-ACELL PERTUSSIS 5-2.5-18.5 LF-MCG/0.5 IM SUSP
0.5000 mL | Freq: Once | INTRAMUSCULAR | Status: AC
  Administered 2014-02-27: 0.5 mL via INTRAMUSCULAR

## 2014-02-27 NOTE — Progress Notes (Signed)
Reports occasional contractions.  Tdap today.

## 2014-02-27 NOTE — Progress Notes (Signed)
Patient is doing well without complaints. FM/labor precautions reviewed. Plan for delivery at 39 weeks secondary to polyhydramnios NST reviewed and reactive

## 2014-03-03 ENCOUNTER — Other Ambulatory Visit: Payer: Medicaid Other

## 2014-03-03 ENCOUNTER — Encounter: Payer: Self-pay | Admitting: *Deleted

## 2014-03-03 NOTE — Progress Notes (Signed)
Pt arrived for scheduled NST/AFI appt at 1405. Due to heavy pt volume, pt was not able to be seen at time of arrival for her 1400 appt. I called for pt at 1450 from the lobby and she was not there. I learned from Antoinette that pt had left 10 minutes prior. She reported to the front office staff that she had to go to work and could not wait any longer. Pt has next scheduled appt on 6/18 for Ob fu and NST/AFI.

## 2014-03-06 ENCOUNTER — Encounter: Payer: Self-pay | Admitting: Obstetrics and Gynecology

## 2014-03-06 ENCOUNTER — Ambulatory Visit (INDEPENDENT_AMBULATORY_CARE_PROVIDER_SITE_OTHER): Payer: Medicaid Other | Admitting: Obstetrics and Gynecology

## 2014-03-06 VITALS — BP 111/71 | HR 90 | Temp 97.2°F | Wt 150.7 lb

## 2014-03-06 DIAGNOSIS — Z3480 Encounter for supervision of other normal pregnancy, unspecified trimester: Secondary | ICD-10-CM

## 2014-03-06 DIAGNOSIS — Z3491 Encounter for supervision of normal pregnancy, unspecified, first trimester: Secondary | ICD-10-CM

## 2014-03-06 DIAGNOSIS — O409XX0 Polyhydramnios, unspecified trimester, not applicable or unspecified: Secondary | ICD-10-CM

## 2014-03-06 DIAGNOSIS — O2620 Pregnancy care for patient with recurrent pregnancy loss, unspecified trimester: Secondary | ICD-10-CM

## 2014-03-06 LAB — US OB FOLLOW UP

## 2014-03-06 LAB — POCT URINALYSIS DIP (DEVICE)
BILIRUBIN URINE: NEGATIVE
Glucose, UA: NEGATIVE mg/dL
HGB URINE DIPSTICK: NEGATIVE
Ketones, ur: NEGATIVE mg/dL
Nitrite: NEGATIVE
Protein, ur: 30 mg/dL — AB
SPECIFIC GRAVITY, URINE: 1.02 (ref 1.005–1.030)
Urobilinogen, UA: 0.2 mg/dL (ref 0.0–1.0)
pH: 7.5 (ref 5.0–8.0)

## 2014-03-06 NOTE — Addendum Note (Signed)
Addended by: Candelaria StagersHAIZLIP, CANDACE E on: 03/06/2014 11:05 AM   Modules accepted: Orders

## 2014-03-06 NOTE — Progress Notes (Signed)
Occasional edema in feet.  

## 2014-03-06 NOTE — Progress Notes (Signed)
Patient is doing well without complaints. Scheduled for IOL on 6/27 at 1930. FM/labor precautions reviewed. Follow up ultrasound scheduled for 6/25 NST reviewed and reactive

## 2014-03-11 ENCOUNTER — Telehealth (HOSPITAL_COMMUNITY): Payer: Self-pay | Admitting: *Deleted

## 2014-03-11 ENCOUNTER — Ambulatory Visit (INDEPENDENT_AMBULATORY_CARE_PROVIDER_SITE_OTHER): Payer: Medicaid Other | Admitting: *Deleted

## 2014-03-11 VITALS — BP 115/69 | HR 82

## 2014-03-11 DIAGNOSIS — O409XX0 Polyhydramnios, unspecified trimester, not applicable or unspecified: Secondary | ICD-10-CM

## 2014-03-11 DIAGNOSIS — O403XX1 Polyhydramnios, third trimester, fetus 1: Secondary | ICD-10-CM

## 2014-03-11 DIAGNOSIS — O3090 Multiple gestation, unspecified, unspecified trimester: Secondary | ICD-10-CM

## 2014-03-11 NOTE — Telephone Encounter (Signed)
Preadmission screen  

## 2014-03-11 NOTE — Progress Notes (Signed)
US for growth scheduled on 6/25,  IOL on 6/27

## 2014-03-13 ENCOUNTER — Ambulatory Visit (INDEPENDENT_AMBULATORY_CARE_PROVIDER_SITE_OTHER): Payer: Medicaid Other | Admitting: Obstetrics & Gynecology

## 2014-03-13 ENCOUNTER — Ambulatory Visit (HOSPITAL_COMMUNITY)
Admission: RE | Admit: 2014-03-13 | Discharge: 2014-03-13 | Disposition: A | Discharge status: Home / Self Care | Payer: Medicaid Other | Source: Ambulatory Visit | Attending: Obstetrics and Gynecology | Admitting: Obstetrics and Gynecology

## 2014-03-13 VITALS — BP 116/76 | HR 73 | Temp 97.1°F | Wt 154.1 lb

## 2014-03-13 DIAGNOSIS — O409XX0 Polyhydramnios, unspecified trimester, not applicable or unspecified: Secondary | ICD-10-CM

## 2014-03-13 DIAGNOSIS — O3090 Multiple gestation, unspecified, unspecified trimester: Secondary | ICD-10-CM

## 2014-03-13 DIAGNOSIS — Z3689 Encounter for other specified antenatal screening: Secondary | ICD-10-CM | POA: Insufficient documentation

## 2014-03-13 DIAGNOSIS — O403XX1 Polyhydramnios, third trimester, fetus 1: Secondary | ICD-10-CM

## 2014-03-13 LAB — POCT URINALYSIS DIP (DEVICE)
BILIRUBIN URINE: NEGATIVE
Glucose, UA: NEGATIVE mg/dL
HGB URINE DIPSTICK: NEGATIVE
Ketones, ur: NEGATIVE mg/dL
Leukocytes, UA: NEGATIVE
NITRITE: NEGATIVE
Protein, ur: NEGATIVE mg/dL
SPECIFIC GRAVITY, URINE: 1.01 (ref 1.005–1.030)
UROBILINOGEN UA: 0.2 mg/dL (ref 0.0–1.0)
pH: 6 (ref 5.0–8.0)

## 2014-03-13 NOTE — Patient Instructions (Signed)
Return to clinic for any obstetric concerns or go to [facility] for evaluation  

## 2014-03-13 NOTE — Progress Notes (Signed)
Ultrasound today EFW 3230g (7 lb 2oz)/32%, cephalic, AFI 25.51 cm.  IOL scheduled 03/15/14 for polyhydramnios.  NST performed today was reviewed and was found to be reactive.  No other complaints or concerns.  Fetal movement and labor precautions reviewed.

## 2014-03-15 ENCOUNTER — Inpatient Hospital Stay (HOSPITAL_COMMUNITY)
Admission: RE | Admit: 2014-03-15 | Discharge: 2014-03-18 | DRG: 774 | Disposition: A | Discharge status: Home / Self Care | Payer: Medicaid Other | Source: Ambulatory Visit | Attending: Obstetrics & Gynecology | Admitting: Obstetrics & Gynecology

## 2014-03-15 ENCOUNTER — Encounter (HOSPITAL_COMMUNITY): Payer: Self-pay

## 2014-03-15 VITALS — BP 115/78 | HR 65 | Temp 98.2°F | Resp 20 | Ht 62.0 in | Wt 154.0 lb

## 2014-03-15 DIAGNOSIS — O403XX1 Polyhydramnios, third trimester, fetus 1: Secondary | ICD-10-CM

## 2014-03-15 DIAGNOSIS — O99892 Other specified diseases and conditions complicating childbirth: Secondary | ICD-10-CM | POA: Diagnosis present

## 2014-03-15 DIAGNOSIS — Z833 Family history of diabetes mellitus: Secondary | ICD-10-CM

## 2014-03-15 DIAGNOSIS — Z22330 Carrier of Group B streptococcus: Secondary | ICD-10-CM

## 2014-03-15 DIAGNOSIS — O9852 Other viral diseases complicating childbirth: Secondary | ICD-10-CM

## 2014-03-15 DIAGNOSIS — O9989 Other specified diseases and conditions complicating pregnancy, childbirth and the puerperium: Secondary | ICD-10-CM

## 2014-03-15 DIAGNOSIS — Z8249 Family history of ischemic heart disease and other diseases of the circulatory system: Secondary | ICD-10-CM

## 2014-03-15 DIAGNOSIS — O409XX0 Polyhydramnios, unspecified trimester, not applicable or unspecified: Principal | ICD-10-CM | POA: Diagnosis present

## 2014-03-15 DIAGNOSIS — B069 Rubella without complication: Secondary | ICD-10-CM | POA: Diagnosis present

## 2014-03-15 LAB — CBC
HCT: 34.1 % — ABNORMAL LOW (ref 36.0–46.0)
HEMOGLOBIN: 11.2 g/dL — AB (ref 12.0–15.0)
MCH: 31.8 pg (ref 26.0–34.0)
MCHC: 32.8 g/dL (ref 30.0–36.0)
MCV: 96.9 fL (ref 78.0–100.0)
PLATELETS: 170 10*3/uL (ref 150–400)
RBC: 3.52 MIL/uL — ABNORMAL LOW (ref 3.87–5.11)
RDW: 13.8 % (ref 11.5–15.5)
WBC: 8.1 10*3/uL (ref 4.0–10.5)

## 2014-03-15 LAB — TYPE AND SCREEN
ABO/RH(D): B POS
Antibody Screen: NEGATIVE

## 2014-03-15 MED ORDER — ZOLPIDEM TARTRATE 5 MG PO TABS
5.0000 mg | ORAL_TABLET | Freq: Once | ORAL | Status: AC
  Administered 2014-03-15: 5 mg via ORAL
  Filled 2014-03-15: qty 1

## 2014-03-15 MED ORDER — ACETAMINOPHEN 325 MG PO TABS: 650.0000 mg | ORAL_TABLET | ORAL | Status: DC | PRN

## 2014-03-15 MED ORDER — LIDOCAINE HCL (PF) 1 % IJ SOLN
30.0000 mL | INTRAMUSCULAR | Status: DC | PRN
  Filled 2014-03-15: qty 30

## 2014-03-15 MED ORDER — OXYTOCIN 40 UNITS IN LACTATED RINGERS INFUSION - SIMPLE MED: 62.5000 mL/h | INTRAVENOUS | Status: DC

## 2014-03-15 MED ORDER — DEXTROSE 5 % IV SOLN
2.5000 10*6.[IU] | INTRAVENOUS | Status: DC
Start: 2014-03-16 — End: 2014-03-16
  Administered 2014-03-16 (×2): 2.5 10*6.[IU] via INTRAVENOUS
  Filled 2014-03-15 (×5): qty 2.5

## 2014-03-15 MED ORDER — FLEET ENEMA 7-19 GM/118ML RE ENEM: 1.0000 | ENEMA | RECTAL | Status: DC | PRN

## 2014-03-15 MED ORDER — LACTATED RINGERS IV SOLN
INTRAVENOUS | Status: DC
  Administered 2014-03-15 – 2014-03-16 (×2): via INTRAVENOUS

## 2014-03-15 MED ORDER — CITRIC ACID-SODIUM CITRATE 334-500 MG/5ML PO SOLN: 30.0000 mL | ORAL | Status: DC | PRN

## 2014-03-15 MED ORDER — ONDANSETRON HCL 4 MG/2ML IJ SOLN
4.0000 mg | Freq: Four times a day (QID) | INTRAMUSCULAR | Status: DC | PRN
  Administered 2014-03-15: 4 mg via INTRAVENOUS
  Filled 2014-03-15: qty 2

## 2014-03-15 MED ORDER — OXYCODONE-ACETAMINOPHEN 5-325 MG PO TABS: 1.0000 | ORAL_TABLET | ORAL | Status: DC | PRN

## 2014-03-15 MED ORDER — OXYTOCIN BOLUS FROM INFUSION: 500.0000 mL | INTRAVENOUS | Status: DC

## 2014-03-15 MED ORDER — FENTANYL CITRATE 0.05 MG/ML IJ SOLN
100.0000 ug | INTRAMUSCULAR | Status: DC | PRN
  Administered 2014-03-15: 100 ug via INTRAVENOUS
  Filled 2014-03-15: qty 2

## 2014-03-15 MED ORDER — LACTATED RINGERS IV SOLN: 500.0000 mL | INTRAVENOUS | Status: DC | PRN

## 2014-03-15 MED ORDER — IBUPROFEN 600 MG PO TABS: 600.0000 mg | ORAL_TABLET | Freq: Four times a day (QID) | ORAL | Status: DC | PRN

## 2014-03-15 MED ORDER — PENICILLIN G POTASSIUM 5000000 UNITS IJ SOLR
5.0000 10*6.[IU] | Freq: Once | INTRAVENOUS | Status: AC
  Administered 2014-03-16: 5 10*6.[IU] via INTRAVENOUS
  Filled 2014-03-15: qty 5

## 2014-03-15 NOTE — H&P (Signed)
Kelli Chacolizabeth P Lofland is a 28 y.o. female [phone] with IUP at 956w0d presenting for induction of labor due to polyhydramnios. Pt states she has been having none contractions, associated with none vaginal bleeding.  Membranes are intact, with active fetal movement.   US 03/13/2014: EFW 3230g (7 lb 2oz)/32%, cephalic, AFI 25.[facility] 7.[facility]  [facility]  Genetic Screen Quad screen negative  Anatomic US  10/30/2013 nml - ? If placenta marginal > repeat in 6 wks > resolved  Glucose Screen 1 hour GTT 102  GBS  POSITIVE  Feeding Preference  breast  Contraception  mirena  Circumcision  none female  First Trimester US = size = dates within 3 days Flu 12/19  Patient Active Problem List   Diagnosis Date Noted  . Normal labor and delivery 03/15/2014  . Polyhydramnios, antepartum 12/23/2013  . 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 Supervision of normal pregnancy in first trimester 08/09/2013     Past Medical History: Past Medical History  Diagnosis Date  . Depression     2008 on medication    Past Surgical History: Past Surgical History  Procedure Laterality Date  . No past surgeries      Obstetrical History: OB History   Grav Para Term Preterm Abortions TAB SAB Ect Mult Living   4    3  2 1        Social History: History   Social History  . Marital Status: Single    Spouse Name: N/A    Number of Children: N/A  . Years of Education: N/A   Social History Main Topics  . Smoking status: Never Smoker   . Smokeless tobacco: None  . Alcohol Use: Yes     Comment: socially  . Drug Use: No  . Sexual Activity: No   Other Topics Concern  . None   Social History Narrative  . None    Family History: Family History  Problem Relation Age of Onset  . Hypertension Mother   . Hypertension Father   . Cancer Maternal Grandmother   . Diabetes Maternal Grandmother   . Cancer Paternal  Grandmother   . Diabetes Paternal Grandmother     Allergies: No Known Allergies  Prescriptions prior to admission  Medication Sig Dispense Refill  . Prenatal Vit-Fe Fumarate-FA (PRENATAL MULTIVITAMIN) TABS tablet Take 1 tablet by mouth daily at 12 noon.         Review of Systems   Constitutional: no fever/chills, doing well  Blood pressure 117/86, pulse 90, temperature 97.8 F (36.6 C), temperature source Oral, resp. rate 18, height 5\' 2"  (1.575 m), weight 69.854 kg (154 lb), last menstrual period 06/15/2013. General appearance: alert, cooperative and no distress Lungs: clear to auscultation bilaterally Heart: regular rate and rhythm Abdomen: soft, non-tender; bowel sounds normal Extremities: Homans sign is negative, no sign of DVT Presentation: cephalic Fetal monitoringBaseline: 140 bpm, mod variability, accels absent, decels absent Uterine activity CTX on TOCO q 3 mins      Prenatal labs: ABO, Rh: B/POS/-- (11/21 1130) Antibody: NEG (11/21 1130) Rubella:  0.45 Not Immune RPR: NON REAC (11/21 1130)  HBsAg: NEGATIVE (11/21 1130)  HIV: NON REACTIVE (11/21 1130)  GBS: Positive (06/03 0000)  1 hr Glucola 102 Genetic screening  Quad neg Anatomy US normal   Prenatal Transfer Tool  Maternal Diabetes: No Genetic Screening: Normal Maternal Ultrasounds/Referrals: Abnormal:  Findings:   Other:Polyhydramnios Fetal Ultrasounds or other Referrals:  None Maternal Substance Abuse:  No Significant Maternal Medications:  None Significant Maternal Lab Results: Lab values include: Group B Strep positive, Rubella non-immune  No results found for this or any previous visit (from the past 24 hour(s)).  Assessment: Kelli Bean is a 28 y.o. G4P0030 at 5760w0d by L=US here for IOL due to polyhydramnios #Labor: Foley bulb placed, will recheck prn #Pain: Analgesia/epidural as needed #FWB: Categ I #ID: GBS pos, will start PCN in active labor #MOF: breast #MOC: Mirena #Circ:   n/a  Sunnie Nielsenlexander, Natalie 03/15/2014, 9:03 PM   Attestation of Attending Supervision of Resident: Evaluation and management procedures were performed by the [facility] Resident under my supervision.  I have seen and examined the patient, reviewed the resident's note and chart, and I agree with the management and plan.  Jaynie CollinsUGONNA  ANYANWU, MD, FACOG Attending Obstetrician & Gynecologist Faculty Practice, [facility] [place]

## 2014-03-16 ENCOUNTER — Inpatient Hospital Stay (HOSPITAL_COMMUNITY): Payer: Medicaid Other | Admitting: Anesthesiology

## 2014-03-16 ENCOUNTER — Encounter (HOSPITAL_COMMUNITY): Payer: Medicaid Other | Admitting: Anesthesiology

## 2014-03-16 ENCOUNTER — Encounter (HOSPITAL_COMMUNITY): Payer: Self-pay

## 2014-03-16 DIAGNOSIS — O9989 Other specified diseases and conditions complicating pregnancy, childbirth and the puerperium: Secondary | ICD-10-CM

## 2014-03-16 DIAGNOSIS — O9852 Other viral diseases complicating childbirth: Secondary | ICD-10-CM

## 2014-03-16 DIAGNOSIS — B069 Rubella without complication: Secondary | ICD-10-CM

## 2014-03-16 DIAGNOSIS — O409XX0 Polyhydramnios, unspecified trimester, not applicable or unspecified: Secondary | ICD-10-CM | POA: Diagnosis not present

## 2014-03-16 DIAGNOSIS — O99892 Other specified diseases and conditions complicating childbirth: Secondary | ICD-10-CM

## 2014-03-16 LAB — RPR

## 2014-03-16 MED ORDER — BENZOCAINE-MENTHOL 20-0.5 % EX AERO
1.0000 "application " | INHALATION_SPRAY | CUTANEOUS | Status: DC | PRN
  Administered 2014-03-16: 1 via TOPICAL
  Filled 2014-03-16: qty 56

## 2014-03-16 MED ORDER — ZOLPIDEM TARTRATE 5 MG PO TABS: 5.0000 mg | ORAL_TABLET | Freq: Every evening | ORAL | Status: DC | PRN

## 2014-03-16 MED ORDER — LANOLIN HYDROUS EX OINT: TOPICAL_OINTMENT | CUTANEOUS | Status: DC | PRN

## 2014-03-16 MED ORDER — IBUPROFEN 600 MG PO TABS
600.0000 mg | ORAL_TABLET | Freq: Four times a day (QID) | ORAL | Status: DC
  Administered 2014-03-17 – 2014-03-18 (×7): 600 mg via ORAL
  Filled 2014-03-16 (×7): qty 1

## 2014-03-16 MED ORDER — ONDANSETRON HCL 4 MG/2ML IJ SOLN: 4.0000 mg | INTRAMUSCULAR | Status: DC | PRN

## 2014-03-16 MED ORDER — PHENYLEPHRINE 40 MCG/ML (10ML) SYRINGE FOR IV PUSH (FOR BLOOD PRESSURE SUPPORT)
80.0000 ug | PREFILLED_SYRINGE | INTRAVENOUS | Status: DC | PRN
  Filled 2014-03-16: qty 2

## 2014-03-16 MED ORDER — SIMETHICONE 80 MG PO CHEW
80.0000 mg | CHEWABLE_TABLET | ORAL | Status: DC | PRN
Start: 2014-03-16 — End: 2014-03-18

## 2014-03-16 MED ORDER — OXYCODONE-ACETAMINOPHEN 5-325 MG PO TABS
1.0000 | ORAL_TABLET | ORAL | Status: DC | PRN
  Administered 2014-03-18: 1 via ORAL
  Filled 2014-03-16: qty 1

## 2014-03-16 MED ORDER — TERBUTALINE SULFATE 1 MG/ML IJ SOLN: 0.2500 mg | Freq: Once | INTRAMUSCULAR | Status: DC | PRN

## 2014-03-16 MED ORDER — DIPHENHYDRAMINE HCL 25 MG PO CAPS: 25.0000 mg | ORAL_CAPSULE | Freq: Four times a day (QID) | ORAL | Status: DC | PRN

## 2014-03-16 MED ORDER — DIBUCAINE 1 % RE OINT: 1.0000 "application " | TOPICAL_OINTMENT | RECTAL | Status: DC | PRN

## 2014-03-16 MED ORDER — LIDOCAINE HCL (PF) 1 % IJ SOLN
INTRAMUSCULAR | Status: DC | PRN
  Administered 2014-03-16 (×2): 8 mL

## 2014-03-16 MED ORDER — OXYTOCIN 40 UNITS IN LACTATED RINGERS INFUSION - SIMPLE MED
1.0000 m[IU]/min | INTRAVENOUS | Status: DC
  Administered 2014-03-16: 4 m[IU]/min via INTRAVENOUS
  Administered 2014-03-16: 2 m[IU]/min via INTRAVENOUS
  Filled 2014-03-16: qty 1000

## 2014-03-16 MED ORDER — TETANUS-DIPHTH-ACELL PERTUSSIS 5-2.5-18.5 LF-MCG/0.5 IM SUSP: 0.5000 mL | Freq: Once | INTRAMUSCULAR | Status: DC

## 2014-03-16 MED ORDER — PHENYLEPHRINE 40 MCG/ML (10ML) SYRINGE FOR IV PUSH (FOR BLOOD PRESSURE SUPPORT)
80.0000 ug | PREFILLED_SYRINGE | INTRAVENOUS | Status: DC | PRN
  Filled 2014-03-16: qty 2
  Filled 2014-03-16: qty 10

## 2014-03-16 MED ORDER — LACTATED RINGERS IV SOLN
500.0000 mL | Freq: Once | INTRAVENOUS | Status: DC
Start: 2014-03-16 — End: 2014-03-16

## 2014-03-16 MED ORDER — SENNOSIDES-DOCUSATE SODIUM 8.6-50 MG PO TABS
2.0000 | ORAL_TABLET | ORAL | Status: DC
  Administered 2014-03-17 – 2014-03-18 (×2): 2 via ORAL
  Filled 2014-03-16 (×2): qty 2

## 2014-03-16 MED ORDER — PRENATAL MULTIVITAMIN CH
1.0000 | ORAL_TABLET | Freq: Every day | ORAL | Status: DC
  Administered 2014-03-17 – 2014-03-18 (×2): 1 via ORAL
  Filled 2014-03-16 (×2): qty 1

## 2014-03-16 MED ORDER — FENTANYL 2.5 MCG/ML BUPIVACAINE 1/10 % EPIDURAL INFUSION (WH - ANES)
14.0000 mL/h | INTRAMUSCULAR | Status: DC | PRN
  Filled 2014-03-16: qty 125

## 2014-03-16 MED ORDER — WITCH HAZEL-GLYCERIN EX PADS
1.0000 "application " | MEDICATED_PAD | CUTANEOUS | Status: DC | PRN
  Administered 2014-03-16: 1 via TOPICAL

## 2014-03-16 MED ORDER — EPHEDRINE 5 MG/ML INJ
10.0000 mg | INTRAVENOUS | Status: DC | PRN
  Filled 2014-03-16: qty 2

## 2014-03-16 MED ORDER — ONDANSETRON HCL 4 MG PO TABS: 4.0000 mg | ORAL_TABLET | ORAL | Status: DC | PRN

## 2014-03-16 MED ORDER — DIPHENHYDRAMINE HCL 50 MG/ML IJ SOLN: 12.5000 mg | INTRAMUSCULAR | Status: DC | PRN

## 2014-03-16 MED ORDER — EPHEDRINE 5 MG/ML INJ
10.0000 mg | INTRAVENOUS | Status: DC | PRN
  Filled 2014-03-16: qty 4
  Filled 2014-03-16: qty 2

## 2014-03-16 MED ORDER — FENTANYL 2.5 MCG/ML BUPIVACAINE 1/10 % EPIDURAL INFUSION (WH - ANES)
INTRAMUSCULAR | Status: DC | PRN
  Administered 2014-03-16: 14 mL/h via EPIDURAL

## 2014-03-16 NOTE — Progress Notes (Signed)
  Subjective: Pt is comfortable at the moment.  Desires epidural once uncomfortable.  No questions or concerns.    Objective: BP 121/72  Pulse 78  Temp(Src) 98.2 F (36.8 C) (Oral)  Resp 18  Ht 5\' 2"  (1.575 m)  Wt 69.854 kg (154 lb)  BMI 28.16 kg/m2  LMP 06/15/2013      FHT:  FHR: 120's bpm, variability: moderate,  accelerations:  Present,  decelerations:  Absent UC:   irregular, every 3-6 minutes SVE:   Dilation: 4 Effacement (%): 70 Station: Ballotable Exam by:: Goodrich CorporationSmith RN  Labs: Lab Results  Component Value Date   WBC 8.1 03/15/2014   HGB 11.2* 03/15/2014   HCT 34.1* 03/15/2014   MCV 96.9 03/15/2014   PLT 170 03/15/2014    Assessment / Plan: Induction of labor due to polyhydramnios, pitocin started  Labor: Progressing normally Preeclampsia:  n/a Fetal Wellbeing:  Category I Pain Control:  Labor support without medications I/D:  GBS pos Anticipated MOD:  NSVD  [facility] CenterMUHAMMAD,WALIDAH 03/16/2014, 9:08 AM

## 2014-03-16 NOTE — Progress Notes (Signed)
   Subjective: Pt reports increase pressure on bladder.  Consents to AROM.  Desires epidural when possible.    Objective: BP 114/73  Pulse 72  Temp(Src) 97.9 F (36.6 C) (Oral)  Resp 18  Ht 5\' 2"  (1.575 m)  Wt 69.854 kg (154 lb)  BMI 28.16 kg/m2  LMP 06/15/2013      FHT:  FHR: 120's bpm, variability: moderate,  accelerations:  Present,  decelerations:  Present intermittent variables. UC:   irregular, every 6-10 minutes SVE:   Dilation: 4.5 Effacement (%): 70 Station: -2 Exam by:: Goodrich CorporationSmith RN  Labs: Lab Results  Component Value Date   WBC 8.1 03/15/2014   HGB 11.2* 03/15/2014   HCT 34.1* 03/15/2014   MCV 96.9 03/15/2014   PLT 170 03/15/2014   AROM light mec; head well applied to cervix  Assessment / Plan: Induction of labor due to polyhydramnios,  progressing well on pitocin  Labor: Progressing normally Preeclampsia:  n/a Fetal Wellbeing:  Category II Pain Control:  Labor support without medications I/D:  GBS neg Anticipated MOD:  NSVD  [facility] CenterMUHAMMAD,WALIDAH 03/16/2014, 12:25 PM

## 2014-03-16 NOTE — Anesthesia Procedure Notes (Signed)
Epidural Patient location during procedure: OB Start time: 03/16/2014 1:28 PM End time: 03/16/2014 1:32 PM  Staffing Anesthesiologist: Leilani AbleHATCHETT, FRANKLIN Performed by: anesthesiologist   Preanesthetic Checklist Completed: patient identified, surgical consent, pre-op evaluation, timeout performed, IV checked, risks and benefits discussed and monitors and equipment checked  Epidural Patient position: sitting Prep: site prepped and draped and DuraPrep Patient monitoring: continuous pulse ox and blood pressure Approach: midline Location: L3-L4 Injection technique: LOR air  Needle:  Needle type: Tuohy  Needle gauge: 17 G Needle length: 9 cm and 9 Needle insertion depth: 5 cm cm Catheter type: closed end flexible Catheter size: 19 Gauge Catheter at skin depth: 10 cm Test dose: negative and Other  Assessment Sensory level: T9 Events: blood not aspirated, injection not painful, no injection resistance, negative IV test and no paresthesia  Additional Notes Reason for block:procedure for pain

## 2014-03-16 NOTE — Progress Notes (Signed)
   Subjective: Pt reports comfortable with epidural.   Objective: BP 115/65  Pulse 81  Temp(Src) 98 F (36.7 C) (Oral)  Resp 18  Ht 5\' 2"  (1.575 m)  Wt 69.854 kg (154 lb)  BMI 28.16 kg/m2  SpO2 99%  LMP 06/15/2013   Total I/O In: 250 [Other:250] Out: -   FHT:  FHR: 130's bpm, variability: moderate,  accelerations:  Present,  decelerations:  Absent UC:   regular, every 2-3 minutes SVE:   Dilation: 6 Effacement (%): 90 Station: -1 Exam by:: [employer] CorporationSmith RN  Labs: Lab Results  Component Value Date   WBC 8.1 03/15/2014   HGB 11.2* 03/15/2014   HCT 34.1* 03/15/2014   MCV 96.9 03/15/2014   PLT 170 03/15/2014    Assessment / Plan: Augmentation of labor, progressing well  Labor: Progressing normally Preeclampsia:  n/a Fetal Wellbeing:  Category I Pain Control:  Epidural I/D:  GBS pos Anticipated MOD:  NSVD  [facility]LPe[facility] HospitalMUHAMMAD,WALIDAH 03/16/2014, 3:31 PM

## 2014-03-16 NOTE — Progress Notes (Addendum)
I spoke with and examined patient and agree with resident/PA/SNM's note and plan of care.  Exam was s/p cervical foley bulb falling out. Pt was not on pitocin at this time.  Plan to start pitocin after pt eats breakfast/showers. Begin pcn for gbs+ now.  Cheral MarkerKimberly R. Booker, CNM, [facility] CenterWHNP-BC 03/16/2014 9:17 AM

## 2014-03-16 NOTE — Progress Notes (Signed)
Kelli Bean is a 28 y.o. G4P0030 at 4548w1d admitted for induction of labor due to Hydramnios.  Subjective: Patient doing well, resting. No pressure or contractions.   Objective: BP 118/64  Pulse 78  Temp(Src) 98.4 F (36.9 C) (Oral)  Resp 18  Ht 5\' 2"  (1.575 m)  Wt 69.854 kg (154 lb)  BMI 28.16 kg/m2  LMP 06/15/2013      FHT:  FHR: 140 bpm, variability: moderate,  accelerations:  Present,  decelerations:  Absent UC:   irregular, every 3-5 minutes SVE:   Dilation: 4 Effacement (%): 50 Station: -3 Exam by:: Dr. Lyn HollingsheadAlexander  Labs: Lab Results  Component Value Date   WBC 8.1 03/15/2014   HGB 11.2* 03/15/2014   HCT 34.1* 03/15/2014   MCV 96.9 03/15/2014   PLT 170 03/15/2014    Assessment / Plan: Induction of labor due to polyhydramnios,  progressing well on pitocin  Labor: Foley bulb out, cervix favorable, starting Pitocin Preeclampsia:  no signs or symptoms of toxicity Fetal Wellbeing:  Category I Pain Control:  Labor support without medications I/D:  PCN with active labor for GBS ppx Anticipated MOD:  NSVD  Sunnie Nielsenlexander, Kelli Bean 03/16/2014, 7:12 AM

## 2014-03-16 NOTE — Anesthesia Preprocedure Evaluation (Signed)
Anesthesia Evaluation  Patient identified by MRN, date of birth, ID band Patient awake    Reviewed: Allergy & Precautions, H&P , NPO status , Patient's Chart, lab work & pertinent test results  Airway Mallampati: I  TM Distance: >3 FB Neck ROM: full    Dental no notable dental hx.    Pulmonary neg pulmonary ROS,    Pulmonary exam normal       Cardiovascular negative cardio ROS      Neuro/Psych negative neurological ROS     GI/Hepatic negative GI ROS, Neg liver ROS,   Endo/Other  negative endocrine ROS  Renal/GU negative Renal ROS     Musculoskeletal   Abdominal Normal abdominal exam  (+)   Peds  Hematology negative hematology ROS (+)   Anesthesia Other Findings   Reproductive/Obstetrics (+) Pregnancy                             Anesthesia Physical Anesthesia Plan  ASA: II  Anesthesia Plan: Epidural   Post-op Pain Management:    Induction:   Airway Management Planned:   Additional Equipment:   Intra-op Plan:   Post-operative Plan:   Informed Consent: I have reviewed the patients History and Physical, chart, labs and discussed the procedure including the risks, benefits and alternatives for the proposed anesthesia with the patient or authorized representative who has indicated his/her understanding and acceptance.     Plan Discussed with:   Anesthesia Plan Comments:         Anesthesia Quick Evaluation  

## 2014-03-17 MED ORDER — MEASLES, MUMPS & RUBELLA VAC ~~LOC~~ INJ
0.5000 mL | INJECTION | Freq: Once | SUBCUTANEOUS | Status: AC
  Administered 2014-03-18: 0.5 mL via SUBCUTANEOUS
  Filled 2014-03-17 (×3): qty 0.5

## 2014-03-17 NOTE — Progress Notes (Signed)
Post Partum Day 1 Subjective: up ad lib, voiding and tolerating PO.  Reports decreased bleeding and pain well controlled with medication.  Infant breastfeeding well.  Desires Mirena for family planning.    Objective: Blood pressure 131/81, pulse 71, temperature 98.4 F (36.9 C), temperature source Oral, resp. rate 18, height 5\' 2"  (1.575 m), weight 69.854 kg (154 lb), last menstrual period 06/15/2013, SpO2 99.00%, unknown if currently breastfeeding.  Physical Exam:  General: alert, cooperative and appears stated age Lochia: appropriate Uterine Fundus: firm Incision: n/a DVT Evaluation: No evidence of DVT seen on physical exam. Negative Homan's sign.   Recent Labs  03/15/14 2030  HGB 11.2*  HCT 34.1*    Assessment/Plan: Normal Postpartum Exam Plan for discharge tomorrow   LOS: 2 days   [facility] LLCMUHAMMAD,Kelli 03/17/2014, 7:29 AM

## 2014-03-17 NOTE — Anesthesia Postprocedure Evaluation (Signed)
  Anesthesia Post-op Note  Patient: Josie DixonElizabeth P Goldammer  Procedure(s) Performed: * No procedures listed *  Patient Location: Mother/Baby  Anesthesia Type:Epidural  Level of Consciousness: awake, alert , oriented and patient cooperative  Airway and Oxygen Therapy: Patient Spontanous Breathing  Post-op Pain: mild  Post-op Assessment: Post-op Vital signs reviewed, Patient's Cardiovascular Status Stable, Respiratory Function Stable, Patent Airway, No signs of Nausea or vomiting, Adequate PO intake, Pain level controlled and No headache  Post-op Vital Signs: Reviewed and stable  Last Vitals:  Filed Vitals:   03/17/14 0700  BP: 131/81  Pulse: 71  Temp: 36.9 C  Resp: 18    Complications: No apparent anesthesia complications

## 2014-03-17 NOTE — Progress Notes (Signed)
UR chart review completed.  

## 2014-03-18 ENCOUNTER — Telehealth: Payer: Self-pay | Admitting: *Deleted

## 2014-03-18 MED ORDER — IBUPROFEN 600 MG PO TABS: 600.0000 mg | ORAL_TABLET | Freq: Four times a day (QID) | ORAL | Status: DC

## 2014-03-18 NOTE — Discharge Summary (Signed)
Obstetric Discharge Summary Reason for Admission: induction of labor for polyhydramnios Prenatal Procedures: NST Intrapartum Procedures: spontaneous vaginal delivery Postpartum Procedures: none Complications-Operative and Postpartum: none  Hospital Course: Pt admitted on 6/27 for IOL for polyhydramnios. She progressed to vaginal delivery on 6/28. Meeting postpartum milestones. No acute issues and is PPD#2. Mirena and breast feeding. No acute issues. Discharge home.  Delivery Note At 6:35 PM a viable and healthy female was delivered via Vaginal, Spontaneous Delivery (Presentation: Left Occiput Anterior).  APGAR: 8, 9; weight pending .   Placenta status: Intact, Spontaneous.  Cord: 3 vessels with the following complications: none.  Anesthesia: Epidural  Episiotomy: None Lacerations: None Est. Blood Loss (mL): 300  Mom to postpartum.  Baby to Couplet care / Skin to Skin.  [facility],Kelli 03/16/2014, 7:07 PM         H/H: Lab Results  Component Value Date/Time   HGB 11.2* 03/15/2014  8:30 PM   HCT 34.1* 03/15/2014  8:30 PM    Filed Vitals:   03/18/14 0549  BP: 115/78  Pulse: 65  Temp: 98.2 F (36.8 C)  Resp: 20    Physical Exam: VSS NAD Abd: Appropriately tender, ND, Fundus @U -1 No c/c/e, Neg homan's sign, neg cords Lochia Appropriate  Discharge Diagnoses: Term Pregnancy-delivered  Discharge Information: Date: 03/31/2011 Activity: pelvic rest Diet: routine  Medications: PNV and Ibuprofen Breast feeding:  Yes Condition: stable Instructions: refer to handout Discharge to: home      Medication List         ibuprofen 600 MG tablet  Commonly known as:  ADVIL,MOTRIN  Take 1 tablet (600 mg total) by mouth every 6 (six) hours.     prenatal multivitamin Tabs tablet  Take 1 tablet by mouth daily at 12 noon.           Follow-up Information   [facility] (For Postpartum Visit and Mirena placement)    Specialty:   Obstetrics and Gynecology   Contact information:   [address] [place] [id number] [phone]      Tawana ScaleODOM, Kelli RYAN 03/18/2014,8:48 AM

## 2014-03-18 NOTE — Lactation Note (Signed)
This note was copied from the chart of Kelli Bean. Lactation ConsDarcus Pesterultation Note Mom exhausted this AM because baby cluster fed throughout the night.  Reassured mom that milk volume should soon increase and baby will be more contented after feeds.  Mom using comfort gels for nipple tenderness.  FOB very supportive.  Discussed what is normal when milk comes to volume and engorgement treatment and prevention.  Mom has a manual pump for prn use.  Lactation outpatient services and support group encouraged. Patient Name: Kelli Bean UJWJX'BToday's Date: 03/18/2014     Maternal Data    Feeding    LATCH Score/Interventions                      Lactation Tools Discussed/Used     Consult Status      Hansel Feinsteinowell, Laura Ann 03/18/2014, 11:30 AM

## 2014-03-18 NOTE — Telephone Encounter (Signed)
Pt left message stating that she gave birth on 6/28 and is experiencing "sharp pain in her tail bone."  She is requesting medication and/or appt for evaluation. I returned pt's call and discussed her concern. She reports that she is able to get the pain to be less depending on her position. She stated that she has not taken anything for pain since leaving the hospital this morning where she was given ibuprofen. She also did not mention this problem while still at the hospital. I advised pt to send someone to her pharmacy and pick up the Rx for ibuprofen. She should take it every 6 hrs for at least 24 hrs and rest. She may also use a heating pad or ice pack to the area if desired. If the problem becomes worse, she may go to [facility] for evaluation. Pt voiced understanding.

## 2014-03-18 NOTE — Discharge Instructions (Signed)
Vaginal Delivery °Care After °Refer to this sheet in the next few weeks. These discharge instructions provide you with information on caring for yourself after delivery. Your caregiver may also give you specific instructions. Your treatment has been planned according to the most current medical practices available, but problems sometimes occur. Call your caregiver if you have any problems or questions after you go home. °HOME CARE INSTRUCTIONS °· Take over-the-counter or prescription medicines only as directed by your caregiver or pharmacist. °· Do not drink alcohol, especially if you are breastfeeding or taking medicine to relieve pain. °· Do not chew or smoke tobacco. °· Do not use illegal drugs. °· Continue to use good perineal care. Good perineal care includes: °¨ Wiping your perineum from front to back. °¨ Keeping your perineum clean. °· Do not use tampons or douche until your caregiver says it is okay. °· Shower, wash your hair, and take tub baths as directed by your caregiver. °· Wear a well-fitting bra that provides breast support. °· Eat healthy foods. °· Drink enough fluids to keep your urine clear or pale yellow. °· Eat high-fiber foods such as whole grain cereals and breads, brown rice, beans, and fresh fruits and vegetables every day. These foods may help prevent or relieve constipation. °· Follow your cargiver's recommendations regarding resumption of activities such as climbing stairs, driving, lifting, exercising, or traveling. °· Talk to your caregiver about resuming sexual activities. Resumption of sexual activities is dependent upon your risk of infection, your rate of healing, and your comfort and desire to resume sexual activity. °· Try to have someone help you with your household activities and your newborn for at least a few days after you leave the hospital. °· Rest as much as possible. Try to rest or take a nap when your newborn is sleeping. °· Increase your activities gradually. °· Keep all  of your scheduled postpartum appointments. It is very important to keep your scheduled follow-up appointments. At these appointments, your caregiver will be checking to make sure that you are healing physically and emotionally. °SEEK MEDICAL CARE IF:  °· You are passing large clots from your vagina. Save any clots to show your caregiver. °· You have a foul smelling discharge from your vagina. °· You have trouble urinating. °· You are urinating frequently. °· You have pain when you urinate. °· You have a change in your bowel movements. °· You have increasing redness, pain, or swelling near your vaginal incision (episiotomy) or vaginal tear. °· You have pus draining from your episiotomy or vaginal tear. °· Your episiotomy or vaginal tear is separating. °· You have painful, hard, or reddened breasts. °· You have a severe headache. °· You have blurred vision or see spots. °· You feel sad or depressed. °· You have thoughts of hurting yourself or your newborn. °· You have questions about your care, the care of your newborn, or medicines. °· You are dizzy or lightheaded. °· You have a rash. °· You have nausea or vomiting. °· You were breastfeeding and have not had a menstrual period within 12 weeks after you stopped breastfeeding. °· You are not breastfeeding and have not had a menstrual period by the 12th week after delivery. °· You have a fever. °SEEK IMMEDIATE MEDICAL CARE IF:  °· You have persistent pain. °· You have chest pain. °· You have shortness of breath. °· You faint. °· You have leg pain. °· You have stomach pain. °· Your vaginal bleeding saturates two or more sanitary pads   in 1 hour. °MAKE SURE YOU:  °· Understand these instructions. °· Will watch your condition. °· Will get help right away if you are not doing well or get worse. ° ° °Document Released: 09/02/2000 Document Revised: 05/30/2012 Document Reviewed: 05/02/2012 °Exit[employer]® Patient Information ©2015 [employer]. This information is not intended to  replace advice given to you by your health care provider. Make sure you discuss any questions you have with your health care provider. ° °

## 2014-03-19 NOTE — Progress Notes (Signed)
NST reactive on 03/11/14 

## 2014-03-25 NOTE — Progress Notes (Signed)
NST reactive on 03/11/14

## 2014-04-18 ENCOUNTER — Ambulatory Visit: Payer: Medicaid Other | Admitting: Obstetrics & Gynecology

## 2014-04-18 ENCOUNTER — Encounter: Payer: Self-pay | Admitting: Obstetrics & Gynecology

## 2014-05-02 ENCOUNTER — Encounter: Payer: Self-pay | Admitting: General Practice

## 2014-05-16 ENCOUNTER — Encounter: Payer: Self-pay | Admitting: General Practice

## 2014-05-19 ENCOUNTER — Emergency Department (HOSPITAL_COMMUNITY)
Admission: EM | Admit: 2014-05-19 | Discharge: 2014-05-19 | Disposition: A | Discharge status: Home / Self Care | Payer: Medicaid Other | Attending: Emergency Medicine | Admitting: Emergency Medicine

## 2014-05-19 ENCOUNTER — Encounter (HOSPITAL_COMMUNITY): Payer: Self-pay | Admitting: Emergency Medicine

## 2014-05-19 DIAGNOSIS — IMO0002 Reserved for concepts with insufficient information to code with codable children: Secondary | ICD-10-CM | POA: Diagnosis not present

## 2014-05-19 DIAGNOSIS — Z8659 Personal history of other mental and behavioral disorders: Secondary | ICD-10-CM | POA: Diagnosis not present

## 2014-05-19 DIAGNOSIS — Z79899 Other long term (current) drug therapy: Secondary | ICD-10-CM | POA: Diagnosis not present

## 2014-05-19 DIAGNOSIS — S0510XA Contusion of eyeball and orbital tissues, unspecified eye, initial encounter: Secondary | ICD-10-CM | POA: Insufficient documentation

## 2014-05-19 DIAGNOSIS — S1093XA Contusion of unspecified part of neck, initial encounter: Secondary | ICD-10-CM | POA: Diagnosis not present

## 2014-05-19 DIAGNOSIS — S0003XA Contusion of scalp, initial encounter: Secondary | ICD-10-CM | POA: Diagnosis not present

## 2014-05-19 DIAGNOSIS — Y93E2 Activity, laundry: Secondary | ICD-10-CM | POA: Insufficient documentation

## 2014-05-19 DIAGNOSIS — Y9289 Other specified places as the place of occurrence of the external cause: Secondary | ICD-10-CM | POA: Insufficient documentation

## 2014-05-19 DIAGNOSIS — S0083XA Contusion of other part of head, initial encounter: Secondary | ICD-10-CM | POA: Insufficient documentation

## 2014-05-19 MED ORDER — TRAMADOL HCL 50 MG PO TABS: 50.0000 mg | ORAL_TABLET | Freq: Four times a day (QID) | ORAL | Status: DC | PRN

## 2014-05-19 MED ORDER — NAPROXEN 500 MG PO TABS: 500.0000 mg | ORAL_TABLET | Freq: Two times a day (BID) | ORAL | Status: DC
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
## 2014-05-19 NOTE — Discharge Instructions (Signed)
Please read and follow all provided instructions.  Your diagnoses today include:  1. Facial contusion, initial encounter     Tests performed today include:  Vital signs. See below for your results today.   Medications prescribed:   Naproxen - anti-inflammatory pain medication  Do not exceed  naproxen every 12 hours, take with food  You have been prescribed an anti-inflammatory medication or NSAID. Take with food. Take smallest effective dose for the shortest duration needed for your pain. Stop taking if you experience stomach pain or vomiting.    Tramadol - narcotic-like pain medication  DO NOT drive or perform any activities that require you to be awake and alert because this medicine can make you drowsy.   Take any prescribed medications only as directed.  Home care instructions:  Follow any educational materials contained in this packet.  Do not take any medications containing aspirin for one week as this can interfere with your body's ability to clot.   BE VERY CAREFUL not to take multiple medicines containing Tylenol (also called acetaminophen). Doing so can lead to an overdose which can damage your liver and cause liver failure and possibly death.   Follow-up instructions: Please follow-up with your primary care provider in the next 3 days for further evaluation of your symptoms.   Return instructions:  SEEK IMMEDIATE MEDICAL ATTENTION IF:  There is confusion or drowsiness (although children frequently become drowsy after injury).   You cannot awaken the injured person.   You have more than one episode of vomiting.   You notice dizziness or unsteadiness which is getting worse, or inability to walk.   You have convulsions or unconsciousness.   You experience severe, persistent headaches not relieved by Tylenol.  You cannot use arms or legs normally.   There are changes in pupil sizes. (This is the black center in the colored part of the eye)   There is  clear or bloody discharge from the nose or ears.   You have change in speech, vision, swallowing, or understanding.   Localized weakness, numbness, tingling, or change in bowel or bladder control.  You have any other emergent concerns.  Additional Information: You have had a head injury which does not appear to require admission at this time.  Your vital signs today were: BP 150/49   Pulse 62   Temp(Src) 98.1 F (36.7 C) (Oral)   Resp 20   SpO2 97% If your blood pressure (BP) was elevated above 135/85 this visit, please have this repeated by your doctor within one month. --------------
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
## 2014-05-20 NOTE — ED Provider Notes (Signed)
Medical screening examination/treatment/procedure(s) were performed by non-physician practitioner and as supervising physician I was immediately available for consultation/collaboration.   EKG Interpretation None        Nashika Coker M Blanca Carreon, MD 05/20/14 1559 

## 2014-05-23 ENCOUNTER — Ambulatory Visit (INDEPENDENT_AMBULATORY_CARE_PROVIDER_SITE_OTHER): Payer: Medicaid Other | Admitting: Obstetrics & Gynecology

## 2014-05-23 VITALS — BP 111/66 | HR 60 | Ht 62.0 in | Wt 131.6 lb

## 2014-05-23 DIAGNOSIS — Z8759 Personal history of other complications of pregnancy, childbirth and the puerperium: Secondary | ICD-10-CM

## 2014-05-23 DIAGNOSIS — Z8742 Personal history of other diseases of the female genital tract: Secondary | ICD-10-CM

## 2014-05-23 DIAGNOSIS — Z30011 Encounter for initial prescription of contraceptive pills: Secondary | ICD-10-CM

## 2014-05-23 DIAGNOSIS — Z3009 Encounter for other general counseling and advice on contraception: Secondary | ICD-10-CM

## 2014-05-23 MED ORDER — NORGESTIM-ETH ESTRAD TRIPHASIC 0.18/0.215/0.25 MG-25 MCG PO TABS: 1.0000 | ORAL_TABLET | Freq: Every day | ORAL | Status: DC

## 2014-05-23 NOTE — Progress Notes (Signed)
Patient ID: Kelli Bean, female   DOB: 1986-03-04, 28 y.o.   MRN: [id number] Subjective: wants OCP     Kelli Bean is a 28 y.o. female who presents for a postpartum visit. She is 9 weeks postpartum following a spontaneous vaginal delivery. I have fully reviewed the prenatal and intrapartum course. The delivery was at 39 gestational weeks. Outcome: spontaneous vaginal delivery. Anesthesia: epidural. Postpartum course has been good. Baby's course has been normal. Baby is feeding by bottle -  . Bleeding no bleeding. Bowel function is normal. Bladder function is normal. Patient is sexually active. Contraception method is OCP (estrogen/progesterone). Postpartum depression screening: negative.  The following portions of the patient's history were reviewed and updated as appropriate: allergies, current medications, past family history, past medical history, past social history, past surgical history and problem list.  Review of Systems Pertinent items are noted in HPI.   Objective:    BP 111/66  Pulse 60  Ht  (1.575 m)  Wt 131 lb 9.6 oz (59.693 kg)  BMI 24.06 kg/m2  Breastfeeding? No  General:  alert, cooperative and no distress   Breasts:     Lungs:    Heart:     Abdomen: soft, non-tender; bowel sounds normal; no masses,  no organomegaly   Vulva:  not evaluated  Vagina: not evaluated  Cervix:     Corpus: not examined  Adnexa:  not evaluated  Rectal Exam: Not performed.        Assessment:     normal postpartum exam. Pap smear not done at today's visit.   Plan:    1. Contraception: OCP (estrogen/progesterone) 2. Trisprintec lo rx 3. Follow up as needed.   Adam Phenix, MD 05/23/2014

## 2014-05-23 NOTE — Patient Instructions (Signed)

## 2014-07-21 ENCOUNTER — Encounter (HOSPITAL_COMMUNITY): Payer: Self-pay | Admitting: Emergency Medicine

## 2014-08-07 IMAGING — US US OB FOLLOW-UP
1 series · 12 of 28 positions shown · non-contrast
Comparison: none

[Series 1: us ob follow up · 12 of 35 slices shown]
[im 2/35]
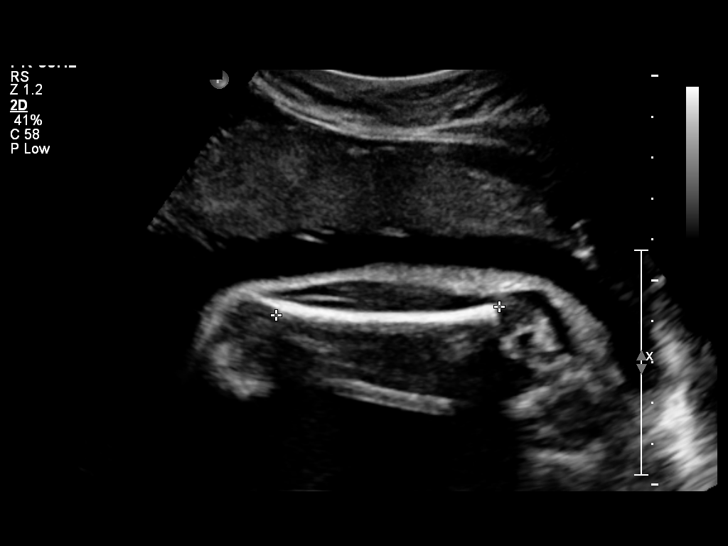
[im 4/35]
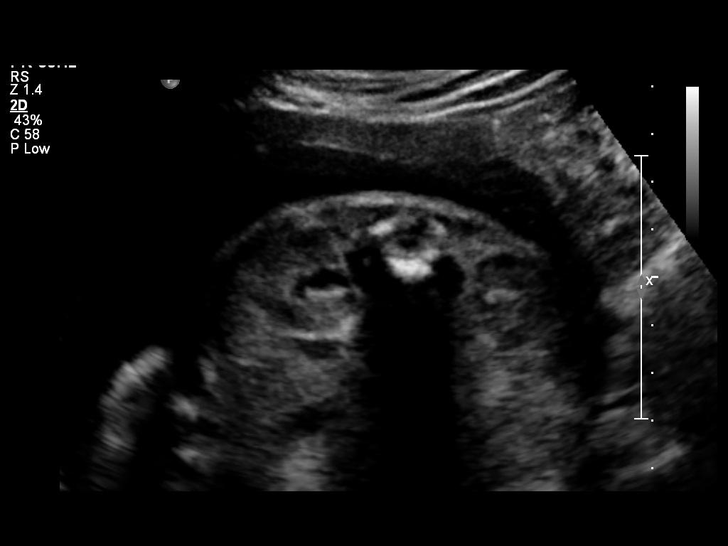
[im 7/35]
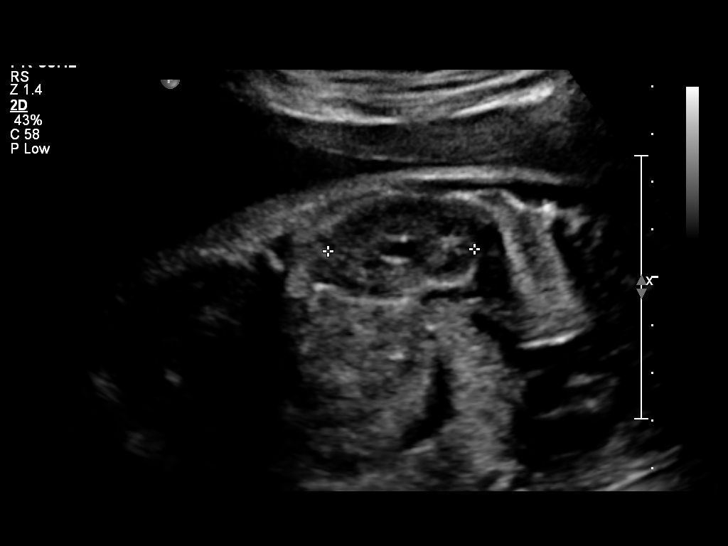
[im 11/35]
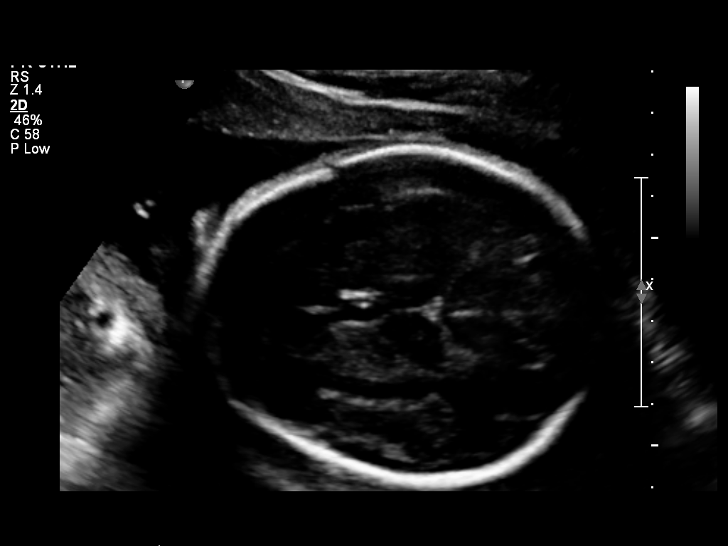
[im 13/35]
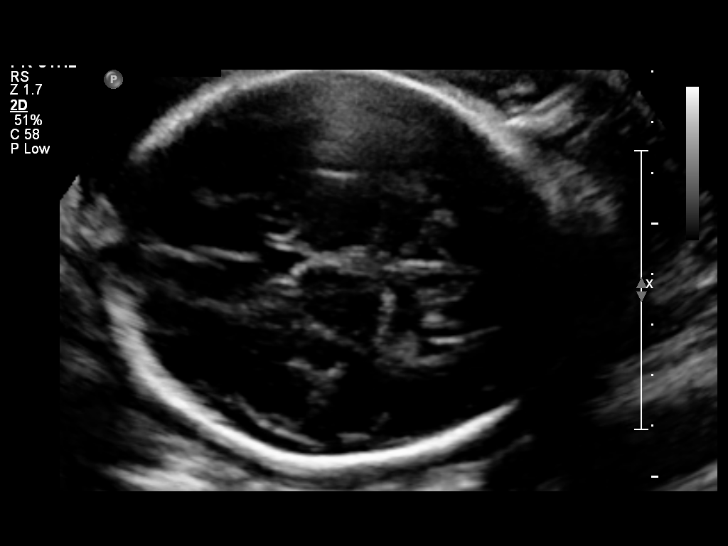
[im 16/35]
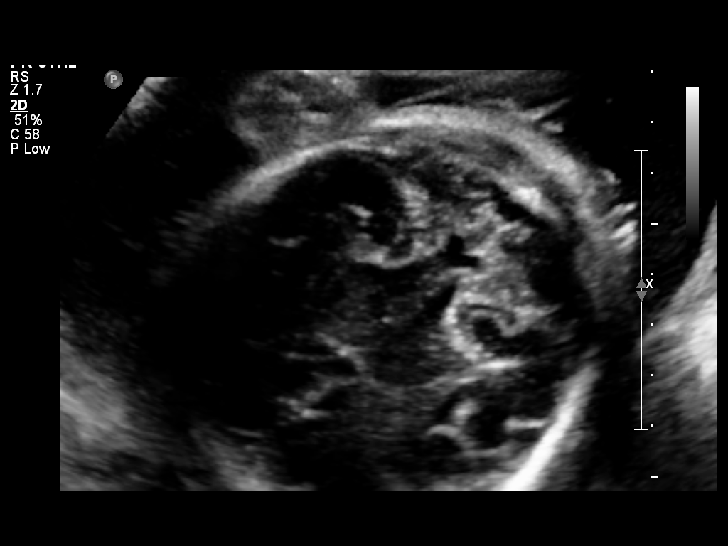
[im 19/35]
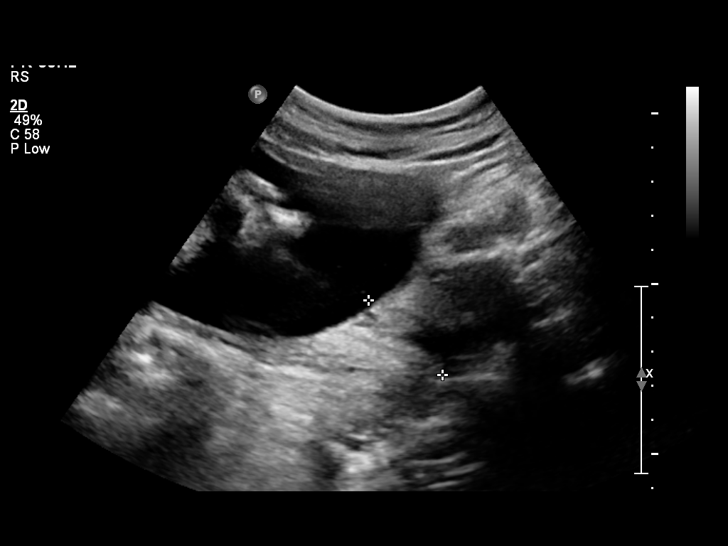
[im 22/35]
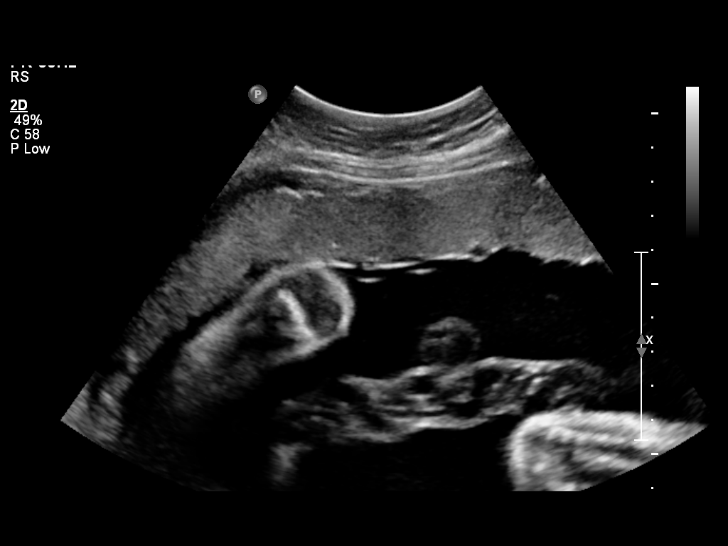
[im 24/35]
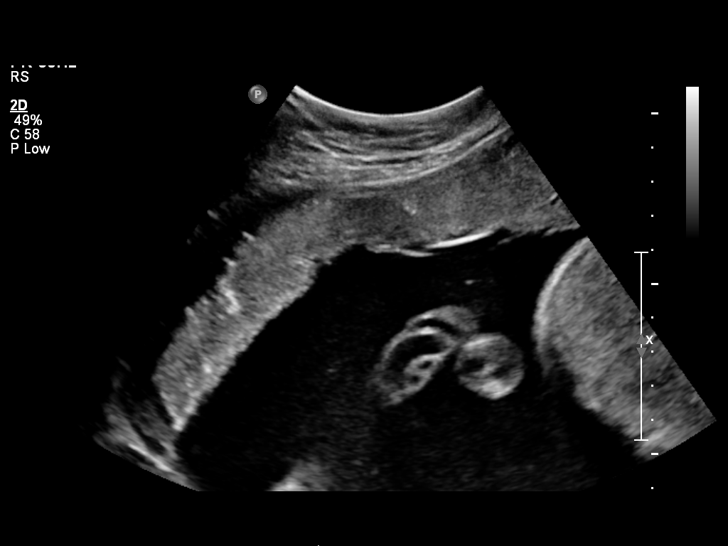
[im 28/35]
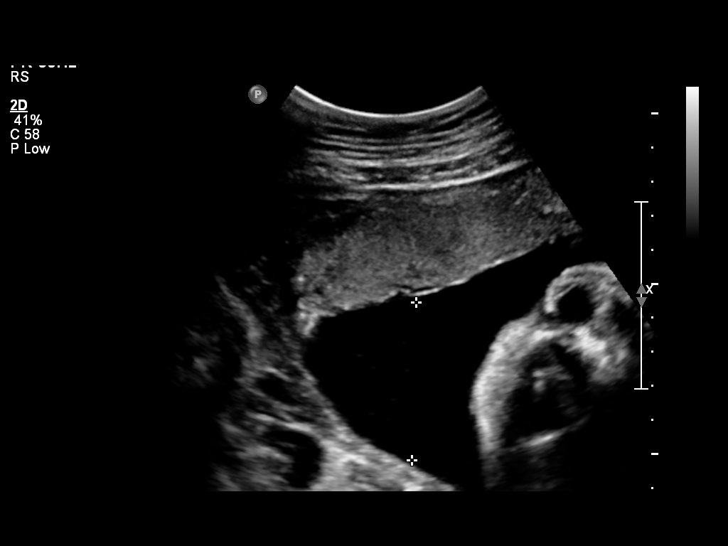
[im 31/35]
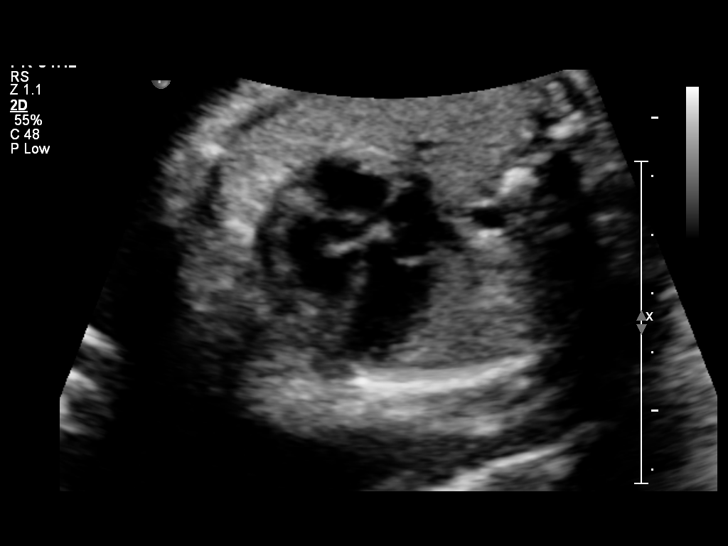
[im 33/35]
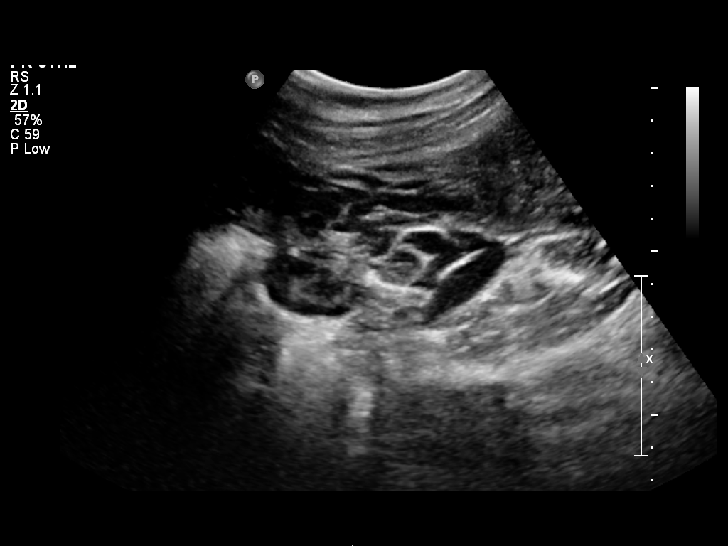

[12 of 28 positions shown; findings below may reference images not displayed]

OBSTETRICS REPORT
                      (Signed Final 01/14/2014 [DATE])

Service(s) Provided

 US OB FOLLOW UP                                       76816.1
Indications

 Polyhydramnios
Fetal Evaluation

 Num Of Fetuses:    1
 Fetal Heart Rate:  139                          bpm
 Cardiac Activity:  Observed
 Presentation:      Cephalic
 Placenta:          Anterior, above cervical os
 P. Cord            Previously Visualized
 Insertion:

 Amniotic Fluid
 AFI FV:      Subjectively increased
 AFI Sum:     24.02   cm       95  %Tile     Larg Pckt:    7.81  cm
 RUQ:   7.81    cm   RLQ:    4.61   cm    LUQ:   6.26    cm   LLQ:    5.34   cm
Biometry

 BPD:     76.8  mm     G. Age:  30w 6d                CI:        69.97   70 - 86
                                                      FL/HC:      18.6   19.2 -

 HC:     292.9  mm     G. Age:  32w 2d       68  %    HC/AC:      1.16   0.99 -

 AC:     253.2  mm     G. Age:  29w 4d       21  %    FL/BPD:     70.8   71 - 87
 FL:      54.4  mm     G. Age:  28w 5d        6  %    FL/AC:      21.5   20 - 24
 HUM:     45.6  mm     G. Age:  27w 0d      < 5  %

 Est. FW:    1909  gm      3 lb 2 oz     37  %
Gestational Age

 LMP:           30w 3d        Date:  06/15/13                 EDD:   03/22/14
 U/S Today:     30w 3d                                        EDD:   03/22/14
 Best:          30w 3d     Det. By:  LMP  (06/15/13)          EDD:   03/22/14
Anatomy

 Cranium:          Appears normal         Aortic Arch:      Previously seen
 Fetal Cavum:      Appears normal         Ductal Arch:      Previously seen
 Ventricles:       Appears normal         Diaphragm:        Appears normal
 Choroid Plexus:   Previously seen        Stomach:          Appears normal, left
                                                            sided
 Cerebellum:       Previously seen        Abdomen:          Appears normal
 Posterior Fossa:  Previously seen        Abdominal Wall:   Previously seen
 Nuchal Fold:      Previously seen        Cord Vessels:     Previously seen
 Face:             Orbits and profile     Kidneys:          Appear normal
                   previously seen
 Lips:             Previously seen        Bladder:          Appears normal
 Heart:            Appears normal         Spine:            Previously seen
                   (4CH, axis, and
                   situs)
 RVOT:             Previously seen        Lower             Previously seen
                                          Extremities:
 LVOT:             Previously seen        Upper             Previously seen
                                          Extremities:

 Other:  Heels and 5th digit previously visualized. Nasal bone previously
         visualized.
Targeted Anatomy

 Fetal Central Nervous System
 Lat. Ventricles:
Cervix Uterus Adnexa

 Cervical Length:    3.07     cm

 Cervix:       Normal appearance by transabdominal scan.
 Uterus:       No abnormality visualized.
 Cul De Sac:   No free fluid seen.

 Left Ovary:    Within normal limits.
 Right Ovary:   Within normal limits.

 Adnexa:     No abnormality visualized.
Impression

 SIUP at 30+3 weeks
 Normal interval anatomy; anatomic survey complete
 Mild polyhydramnios vs high normal AFV (remote read)
 Appropriate interval growth with EFW at the 37th %tile
Recommendations

 Follow-up ultrasound for growth and AFV in 4 weeks
 Would start testing at 32 weeks and can d/c if AFV comes
 down into normal range

## 2014-08-22 ENCOUNTER — Encounter: Payer: Self-pay | Admitting: Obstetrics & Gynecology

## 2014-12-09 ENCOUNTER — Inpatient Hospital Stay (HOSPITAL_COMMUNITY): Payer: Medicaid Other

## 2014-12-09 ENCOUNTER — Inpatient Hospital Stay (HOSPITAL_COMMUNITY)
Admission: AD | Admit: 2014-12-09 | Discharge: 2014-12-09 | Disposition: A | Discharge status: Home / Self Care | Payer: Medicaid Other | Source: Ambulatory Visit | Attending: Family Medicine | Admitting: Family Medicine

## 2014-12-09 ENCOUNTER — Encounter (HOSPITAL_COMMUNITY): Payer: Self-pay | Admitting: *Deleted

## 2014-12-09 DIAGNOSIS — O209 Hemorrhage in early pregnancy, unspecified: Secondary | ICD-10-CM | POA: Insufficient documentation

## 2014-12-09 DIAGNOSIS — O200 Threatened abortion: Secondary | ICD-10-CM

## 2014-12-09 DIAGNOSIS — O26899 Other specified pregnancy related conditions, unspecified trimester: Secondary | ICD-10-CM

## 2014-12-09 DIAGNOSIS — R109 Unspecified abdominal pain: Secondary | ICD-10-CM | POA: Diagnosis present

## 2014-12-09 DIAGNOSIS — Z3A00 Weeks of gestation of pregnancy not specified: Secondary | ICD-10-CM | POA: Diagnosis not present

## 2014-12-09 LAB — URINALYSIS, ROUTINE W REFLEX MICROSCOPIC
BILIRUBIN URINE: NEGATIVE
GLUCOSE, UA: NEGATIVE mg/dL
KETONES UR: NEGATIVE mg/dL
LEUKOCYTES UA: NEGATIVE
NITRITE: NEGATIVE
PH: 5.5 (ref 5.0–8.0)
PROTEIN: NEGATIVE mg/dL
Specific Gravity, Urine: 1.025 (ref 1.005–1.030)
Urobilinogen, UA: 0.2 mg/dL (ref 0.0–1.0)

## 2014-12-09 LAB — CBC
HCT: 39.1 % (ref 36.0–46.0)
HEMOGLOBIN: 12.8 g/dL (ref 12.0–15.0)
MCH: 31.5 pg (ref 26.0–34.0)
MCHC: 32.7 g/dL (ref 30.0–36.0)
MCV: 96.3 fL (ref 78.0–100.0)
PLATELETS: 288 10*3/uL (ref 150–400)
RBC: 4.06 MIL/uL (ref 3.87–5.11)
RDW: 12.6 % (ref 11.5–15.5)
WBC: 10.2 10*3/uL (ref 4.0–10.5)

## 2014-12-09 LAB — WET PREP, GENITAL
Clue Cells Wet Prep HPF POC: NONE SEEN
Trich, Wet Prep: NONE SEEN
Yeast Wet Prep HPF POC: NONE SEEN

## 2014-12-09 LAB — HCG, QUANTITATIVE, PREGNANCY: hCG, Beta Chain, Quant, S: 57 m[IU]/mL — ABNORMAL HIGH (ref <5)

## 2014-12-09 LAB — URINE MICROSCOPIC-ADD ON

## 2014-12-09 LAB — POCT PREGNANCY, URINE: Preg Test, Ur: POSITIVE — AB

## 2014-12-09 MED ORDER — TRAMADOL HCL 50 MG PO TABS
100.0000 mg | ORAL_TABLET | Freq: Once | ORAL | Status: AC
  Administered 2014-12-09: 100 mg via ORAL
  Filled 2014-12-09: qty 2

## 2014-12-09 MED ORDER — TRAMADOL HCL 50 MG PO TABS: 50.0000 mg | ORAL_TABLET | Freq: Four times a day (QID) | ORAL | Status: DC | PRN

## 2014-12-09 NOTE — Progress Notes (Signed)
Urine in lab 

## 2014-12-09 NOTE — MAU Note (Signed)
+  HPT a couple wks ago.  Started cramping yesterday while at work.  Last night started bleeding, off and on. Has gotten heavier, filled up 3 pads.

## 2014-12-09 NOTE — Discharge Instructions (Signed)

## 2014-12-09 NOTE — MAU Provider Note (Deleted)
History     CSN: [id number]  Arrival date and time: 12/09/14 0806   First Provider Initiated Contact with Patient 12/09/14 [phone]      Chief Complaint  Patient presents with  . 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 The bleeding began as spotting and clotting and has now progressed to a flow that has caused her to bleed enough to change 3 pads. She describes her pelvic cramping as uncomfortable and 8/10. She has not taken anything for the pain prior to arrival. Denies trauma to abdomen, pelvis or vagina. She had a positive OCT UPT 2 weeks ago. LMP was Feb 9. She has not had any prenatal care.   OB History    Gravida Para Term Preterm AB TAB SAB Ectopic Multiple Living   4 1 1  3  2 1  1       Past Medical History  Diagnosis Date  . Depression     2008 on medication    Past Surgical History  Procedure Laterality Date  . No past surgeries      Family History  Problem Relation Age of Onset  . Hypertension Mother   . Hypertension Father   . Cancer Maternal Grandmother   . Diabetes Maternal Grandmother   . Cancer Paternal Grandmother   . Diabetes Paternal Grandmother     History  Substance Use Topics  . Smoking status: Never Smoker   . Smokeless tobacco: Not on file  . Alcohol Use: Yes     Comment: socially    Allergies: No Known Allergies  Prescriptions prior to admission  Medication Sig Dispense Refill Last Dose  . ibuprofen (ADVIL,MOTRIN) 600 MG tablet Take 1 tablet (600 mg total) by mouth every 6 (six) hours. 30 tablet 0 Not Taking  . naproxen (NAPROSYN) 500 MG tablet Take 1 tablet (500 mg total) by mouth 2 (two) times daily. 20 tablet 0 Not Taking  . Norgestimate-Ethinyl Estradiol Triphasic (ORTHO TRI-CYCLEN LO)  0.18/0.215/0.25 MG-25 MCG tab Take 1 tablet by mouth daily. 1 Package 11   . Prenatal Vit-Fe Fumarate-FA (PRENATAL MULTIVITAMIN) TABS tablet Take 1 tablet by mouth daily at 12 noon.   Taking  . traMADol (ULTRAM) 50 MG tablet Take 1 tablet (50 mg total) by mouth every 6 (six) hours as needed. 10 tablet 0 Not Taking    Review of Systems  Constitutional: Negative for fever, chills, weight loss, malaise/fatigue and diaphoresis.  HENT: Negative.   Eyes: Negative.   Respiratory: Negative for cough and wheezing.   Cardiovascular: Negative for chest pain and palpitations.  Gastrointestinal: Positive for abdominal pain. Negative for heartburn, nausea, vomiting, diarrhea, constipation and blood in stool.  Genitourinary: Negative for dysuria, urgency, frequency and hematuria.  Musculoskeletal: Negative.   Skin: Negative.   Neurological: Negative.  Negative for weakness and headaches.  Endo/Heme/Allergies: Negative.   Psychiatric/Behavioral: Negative.    Physical Exam   Blood pressure 132/75, pulse 73, temperature 99.3 F (37.4 C), temperature source Oral, resp. rate 18, height 5\' 1"  (1.549 m), weight 60.328 kg (133 lb), last menstrual period 10/28/2014, not currently breastfeeding.  Physical Exam  Constitutional: She is oriented to person, place, and time. She appears well-developed and well-nourished.  Appears uncomfortable due to pain  HENT:  Head: Normocephalic and atraumatic.  Eyes: Conjunctivae and EOM are normal. Pupils are equal, round, and reactive to light.  Neck: Normal range of motion. Neck supple.  Cardiovascular: Normal rate, regular rhythm, normal heart sounds and intact distal pulses.   Respiratory: Effort normal and breath sounds normal.  GI: Soft. Bowel sounds are normal. She exhibits no distension and no mass. There is no tenderness. There is no rebound and no guarding.  Musculoskeletal: Normal range of motion. She exhibits no edema or tenderness.  Lymphadenopathy:    She  has no cervical adenopathy.  Neurological: She is alert and oriented to person, place, and time. No cranial nerve deficit.  Skin: Skin is warm and dry. She is not diaphoretic.  Psychiatric: She has a normal mood and affect. Her behavior is normal.    MAU Course  Procedures  MDM: UA           CBC           Pelvic/Transvaginal US             Assessment and Plan    Acie Fredrickson 12/09/2014, 9:00 AM

## 2014-12-10 LAB — HIV ANTIBODY (ROUTINE TESTING W REFLEX): HIV SCREEN 4TH GENERATION: NONREACTIVE

## 2014-12-10 LAB — GC/CHLAMYDIA PROBE AMP (~~LOC~~) NOT AT ARMC
Chlamydia: POSITIVE — AB
Neisseria Gonorrhea: NEGATIVE

## 2014-12-11 ENCOUNTER — Telehealth (HOSPITAL_COMMUNITY): Payer: Self-pay | Admitting: *Deleted

## 2014-12-11 ENCOUNTER — Encounter (HOSPITAL_COMMUNITY): Payer: Self-pay

## 2014-12-11 ENCOUNTER — Inpatient Hospital Stay (HOSPITAL_COMMUNITY)
Admission: RE | Admit: 2014-12-11 | Discharge: 2014-12-11 | Disposition: A | Discharge status: Home / Self Care | Payer: Medicaid Other | Source: Ambulatory Visit | Attending: Obstetrics and Gynecology | Admitting: Obstetrics and Gynecology

## 2014-12-11 DIAGNOSIS — A749 Chlamydial infection, unspecified: Secondary | ICD-10-CM

## 2014-12-11 DIAGNOSIS — Z3A01 Less than 8 weeks gestation of pregnancy: Secondary | ICD-10-CM | POA: Insufficient documentation

## 2014-12-11 DIAGNOSIS — R102 Pelvic and perineal pain: Secondary | ICD-10-CM | POA: Diagnosis not present

## 2014-12-11 DIAGNOSIS — O98311 Other infections with a predominantly sexual mode of transmission complicating pregnancy, first trimester: Secondary | ICD-10-CM

## 2014-12-11 DIAGNOSIS — O039 Complete or unspecified spontaneous abortion without complication: Secondary | ICD-10-CM | POA: Diagnosis not present

## 2014-12-11 DIAGNOSIS — O98811 Other maternal infectious and parasitic diseases complicating pregnancy, first trimester: Secondary | ICD-10-CM | POA: Diagnosis not present

## 2014-12-11 DIAGNOSIS — O9989 Other specified diseases and conditions complicating pregnancy, childbirth and the puerperium: Secondary | ICD-10-CM | POA: Diagnosis present

## 2014-12-11 DIAGNOSIS — O26891 Other specified pregnancy related conditions, first trimester: Secondary | ICD-10-CM

## 2014-12-11 LAB — HCG, QUANTITATIVE, PREGNANCY: hCG, Beta Chain, Quant, S: 15 m[IU]/mL — ABNORMAL HIGH (ref <5)

## 2014-12-11 MED ORDER — AZITHROMYCIN 250 MG PO TABS
1000.0000 mg | ORAL_TABLET | Freq: Once | ORAL | Status: AC
  Administered 2014-12-11: 1000 mg via ORAL
  Filled 2014-12-11: qty 4

## 2014-12-11 MED ORDER — AZITHROMYCIN 500 MG PO TABS: ORAL_TABLET | ORAL | Status: DC

## 2014-12-11 NOTE — MAU Note (Signed)
Pt states was told she was miscarrying on 12/09/2014. Here for repeat BHCG. Was also called and told she was +Chlamydia. Told RX was called in to pharmacy, however was not there when she checked.

## 2014-12-11 NOTE — MAU Note (Signed)
Not in lobby

## 2014-12-11 NOTE — Telephone Encounter (Signed)
Patient returned call, notified her of result and need to complete Rx.  Instructed patient to notify her partner for treatment.  Instructed patient to abstain from sex for seven days post treatment.

## 2014-12-11 NOTE — Telephone Encounter (Signed)
Telephone call to patient regarding positive chlamydia culture, patient not in, left message for patient to call on her mobile number, home number would not accept messages.  Patient has not been treated and Rx called in per protocol to patient's pharmacy.  Patient will need to be instructed to notify partner and to pick up Rx.  Report faxed to health department.

## 2014-12-11 NOTE — MAU Provider Note (Signed)
  History     CSN: [id number]  Arrival date and time: 12/11/14 1805   None     Chief Complaint  Patient presents with  . Repeat BHCG    HPI  Kelli Bean is a 29 y.o. [phone] at 6263w3d who presents today for FU HCG. She states that she was also called about + chlamydia test, and has not had treatment yet. She states that the bleeding is minimal. She denies any abdominal pain. Patient states that she cannot wait for results. She would like to have treatment for chlamydia, but she cannot stay to wait for results of HCG.   Past Medical History  Diagnosis Date  . Depression     2008 on medication    Past Surgical History  Procedure Laterality Date  . No past surgeries      Family History  Problem Relation Age of Onset  . Hypertension Mother   . Hypertension Father   . Cancer Maternal Grandmother   . Diabetes Maternal Grandmother   . Cancer Paternal Grandmother   . Diabetes Paternal Grandmother     History  Substance Use Topics  . Smoking status: Never Smoker   . Smokeless tobacco: Not on file  . Alcohol Use: Yes     Comment: socially    Allergies: No Known Allergies  Prescriptions prior to admission  Medication Sig Dispense Refill Last Dose  . azithromycin (ZITHROMAX) 500 MG tablet Take 2 tablets once 2 tablet 0   . traMADol (ULTRAM) 50 MG tablet Take 1 tablet (50 mg total) by mouth every 6 (six) hours as needed for severe pain. 20 tablet 0     ROS Physical Exam   Blood pressure 138/88, pulse 59, temperature 98.2 F (36.8 C), temperature source Oral, resp. rate 18, height 5\' 1"  (1.549 m), weight 59.648 kg (131 lb 8 oz), last menstrual period 10/28/2014, not currently breastfeeding.  Physical Exam  Nursing note and vitals reviewed. Constitutional: She is oriented to person, place, and time. She appears well-developed and well-nourished.  Cardiovascular: Normal rate.   Respiratory: Effort normal.  GI: Soft.  Neurological: She is alert and oriented to  person, place, and time.  Skin: Skin is warm and dry.  Psychiatric: She has a normal mood and affect.    MAU Course  Procedures  Results for Kelli ChacoGLADDING, Raeana P (MRN [id number]) as of 12/11/2014 21:48  Ref. Range 12/09/2014 09:10 12/09/2014 10:01 12/09/2014 10:22 12/11/2014 20:32  hCG, Beta Chain, Quant, S Latest Range: <5 mIU/mL 57 (H)   15 (H)   0009: Left message with patient to call with results.  [id number]: Patient has not called back for results. Assessment and Plan   1. Pelvic pain affecting pregnancy in first trimester, antepartum   2. Chlamydia infection, current pregnancy, first trimester   Spontaneous abortion   DC Home FU in 1 week Treated with 1g azithromycin here in MAU today.   Follow-up Information    [facility]   Specialty:  Obstetrics and Gynecology   Why:  Friday 12/19/2014 between 8am-11am    Contact information:   [address] [place] [id number] [phone]       Tawnya CrookHogan, Uno Esau Donovan 12/11/2014, 9:48 PM

## 2014-12-12 NOTE — Progress Notes (Signed)
Quick Note:  Treated in MAU 3/24 ______

## 2014-12-12 NOTE — Discharge Instructions (Signed)
Miscarriage A miscarriage is the sudden loss of an unborn baby (fetus) before the 20th week of pregnancy. Most miscarriages happen in the first 3 months of pregnancy. Sometimes, it happens before a woman even knows she is pregnant. A miscarriage is also called a "spontaneous miscarriage" or "early pregnancy loss." Having a miscarriage can be an emotional experience. Talk with your caregiver about any questions you may have about miscarrying, the grieving process, and your future pregnancy plans. CAUSES   Problems with the fetal chromosomes that make it impossible for the baby to develop normally. Problems with the baby's genes or chromosomes are most often the result of errors that occur, by chance, as the embryo divides and grows. The problems are not inherited from the parents.  Infection of the cervix or uterus.   Hormone problems.   Problems with the cervix, such as having an incompetent cervix. This is when the tissue in the cervix is not strong enough to hold the pregnancy.   Problems with the uterus, such as an abnormally shaped uterus, uterine fibroids, or congenital abnormalities.   Certain medical conditions.   Smoking, drinking alcohol, or taking illegal drugs.   Trauma.  Often, the cause of a miscarriage is unknown.  SYMPTOMS   Vaginal bleeding or spotting, with or without cramps or pain.  Pain or cramping in the abdomen or lower back.  Passing fluid, tissue, or blood clots from the vagina. DIAGNOSIS  Your caregiver will perform a physical exam. You may also have an ultrasound to confirm the miscarriage. Blood or urine tests may also be ordered. TREATMENT   Sometimes, treatment is not necessary if you naturally pass all the fetal tissue that was in the uterus. If some of the fetus or placenta remains in the body (incomplete miscarriage), tissue left behind may become infected and must be removed. Usually, a dilation and curettage (D and C) procedure is performed.  During a D and C procedure, the cervix is widened (dilated) and any remaining fetal or placental tissue is gently removed from the uterus.  Antibiotic medicines are prescribed if there is an infection. Other medicines may be given to reduce the size of the uterus (contract) if there is a lot of bleeding.  If you have Rh negative blood and your baby was Rh positive, you will need a Rh immunoglobulin shot. This shot will protect any future baby from having Rh blood problems in future pregnancies. HOME CARE INSTRUCTIONS   Your caregiver may order bed rest or may allow you to continue light activity. Resume activity as directed by your caregiver.  Have someone help with home and family responsibilities during this time.   Keep track of the number of sanitary pads you use each day and how soaked (saturated) they are. Write down this information.   Do not use tampons. Do not douche or have sexual intercourse until approved by your caregiver.   Only take over-the-counter or prescription medicines for pain or discomfort as directed by your caregiver.   Do not take aspirin. Aspirin can cause bleeding.   Keep all follow-up appointments with your caregiver.   If you or your partner have problems with grieving, talk to your caregiver or seek counseling to help cope with the pregnancy loss. Allow enough time to grieve before trying to get pregnant again.  SEEK IMMEDIATE MEDICAL CARE IF:   You have severe cramps or pain in your back or abdomen.  You have a fever.  You pass large blood clots (walnut-sized   or larger) ortissue from your vagina. Save any tissue for your caregiver to inspect.   Your bleeding increases.   You have a thick, bad-smelling vaginal discharge.  You become lightheaded, weak, or you faint.   You have chills.  MAKE SURE YOU:  Understand these instructions.  Will watch your condition.  Will get help right away if you are not doing well or get  worse. Document Released: 03/01/2001 Document Revised: 12/31/2012 Document Reviewed: 10/25/2011 [employer] Patient Information 2015 [employer]. This information is not intended to replace advice given to you by your health care provider. Make sure you discuss any questions you have with your health care provider.  

## 2014-12-16 ENCOUNTER — Encounter: Payer: Self-pay | Admitting: Advanced Practice Midwife

## 2014-12-16 NOTE — MAU Provider Note (Signed)
History     CSN: [id number]  Arrival date and time: 12/09/14 0806   First Provider Initiated Contact with Patient 12/09/14 [phone]      Chief Complaint  Patient presents with  . 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 The bleeding began as spotting and clotting and has now progressed to a flow that has caused her to bleed enough to change 3 pads. She describes her pelvic cramping as uncomfortable and 8/10. She has not taken anything for the pain prior to arrival. Denies trauma to abdomen, pelvis or vagina. She had a positive OCT UPT 2 weeks ago. LMP was Feb 9. She has not had any prenatal care.   OB History    Gravida Para Term Preterm AB TAB SAB Ectopic Multiple Living   5 1 1  3  2 1  1       Past Medical History  Diagnosis Date  . Depression     2008 on medication    Past Surgical History  Procedure Laterality Date  . No past surgeries      Family History  Problem Relation Age of Onset  . Hypertension Mother   . Hypertension Father   . Cancer Maternal Grandmother   . Diabetes Maternal Grandmother   . Cancer Paternal Grandmother   . Diabetes Paternal Grandmother     History  Substance Use Topics  . Smoking status: Never Smoker   . Smokeless tobacco: Not on file  . Alcohol Use: Yes     Comment: socially    Allergies: No Known Allergies  No prescriptions prior to admission    Review of Systems  Constitutional: Negative for fever, chills and diaphoresis.  Respiratory: Negative for cough.   Cardiovascular: Negative for chest pain.  Gastrointestinal: Positive for abdominal pain. Negative for nausea and vomiting.  Neurological: Negative for weakness and headaches.   Physical Exam   Blood pressure 125/68, pulse 68,  temperature 99.3 F (37.4 C), temperature source Oral, resp. rate 18, height 5\' 1"  (1.549 m), weight 133 lb (60.328 kg), last menstrual period 10/28/2014, not currently breastfeeding.  Physical Exam  Constitutional: She is oriented to person, place, and time. She appears well-developed and well-nourished.  Appears uncomfortable due to pain  HENT:  Head: Normocephalic and atraumatic.  Eyes: Conjunctivae and EOM are normal. Pupils are equal, round, and reactive to light.  Neck: Normal range of motion. Neck supple.  Cardiovascular: Normal rate, regular rhythm, normal heart sounds and intact distal pulses.   Respiratory: Effort normal and breath sounds normal.  GI: Soft. Bowel sounds are normal. She exhibits no distension and no mass. There is no tenderness. There is no rebound and no guarding.  Genitourinary: There is no lesion or injury on the right labia. There is no lesion or injury on the left labia. Uterus is not enlarged. Cervix exhibits no motion tenderness, no discharge and no friability. Right adnexum displays no mass and no tenderness. Left adnexum displays no mass and no tenderness. There is bleeding (Small amount of dark red blood in the vault. No active bleeding.) in the vagina. No vaginal discharge found.  Musculoskeletal: Normal range of motion. She exhibits no edema or tenderness.  Lymphadenopathy:    She has no cervical adenopathy.  Neurological: She is alert and  oriented to person, place, and time. No cranial nerve deficit.  Skin: Skin is warm and dry. She is not diaphoretic.  Psychiatric: She has a normal mood and affect. Her behavior is normal.    Urinalysis, Routine w reflex microscopic   Collection Time: 12/09/14  8:10 AM  Result Value Ref Range   Color, Urine YELLOW YELLOW   APPearance CLEAR CLEAR   Specific Gravity, Urine 1.025 1.005 - 1.030   pH 5.5 5.0 - 8.0   Glucose, UA NEGATIVE NEGATIVE mg/dL   Hgb urine dipstick MODERATE (A) NEGATIVE   Bilirubin Urine  NEGATIVE NEGATIVE   Ketones, ur NEGATIVE NEGATIVE mg/dL   Protein, ur NEGATIVE NEGATIVE mg/dL   Urobilinogen, UA 0.2 0.0 - 1.0 mg/dL   Nitrite NEGATIVE NEGATIVE   Leukocytes, UA NEGATIVE NEGATIVE  Urine microscopic-add on   Collection Time: 12/09/14  8:10 AM  Result Value Ref Range   Squamous Epithelial / LPF FEW (A) RARE   RBC / HPF 0-2 <3 RBC/hpf   Bacteria, UA RARE RARE  Pregnancy, urine POC   Collection Time: 12/09/14  8:37 AM  Result Value Ref Range   Preg Test, Ur POSITIVE (A) NEGATIVE  CBC   Collection Time: 12/09/14  9:10 AM  Result Value Ref Range   WBC 10.2 4.0 - 10.5 K/uL   RBC 4.06 3.87 - 5.11 MIL/uL   Hemoglobin 12.8 12.0 - 15.0 g/dL   HCT 16.1 09.6 - 04.5 %   MCV 96.3 78.0 - 100.0 fL   MCH 31.5 26.0 - 34.0 pg   MCHC 32.7 30.0 - 36.0 g/dL   RDW 40.9 81.1 - 91.4 %   Platelets 288 150 - 400 K/uL  hCG, quantitative, pregnancy   Collection Time: 12/09/14  9:10 AM  Result Value Ref Range   hCG, Beta Chain, Quant, S 57 (H) <5 mIU/mL  HIV antibody   Collection Time: 12/09/14  9:10 AM  Result Value Ref Range   HIV Screen 4th Generation wRfx Non Reactive Non Reactive  Wet prep, genital   Collection Time: 12/09/14 10:22 AM  Result Value Ref Range   Yeast Wet Prep HPF POC NONE SEEN NONE SEEN   Trich, Wet Prep NONE SEEN NONE SEEN   Clue Cells Wet Prep HPF POC NONE SEEN NONE SEEN   WBC, Wet Prep HPF POC FEW (A) NONE SEEN   US Ob Comp Less 14 Wks  12/09/2014   CLINICAL DATA:  Cramping, vaginal bleeding.  EXAM: OBSTETRIC <14 WK [place] AND TRANSVAGINAL OB US  TECHNIQUE: Both transabdominal and transvaginal ultrasound examinations were performed for complete evaluation of the gestation as well as the maternal uterus, adnexal regions, and pelvic cul-de-sac. Transvaginal technique was performed to assess early pregnancy.  COMPARISON:  None.  FINDINGS: Intrauterine gestational sac: Not visualized  Yolk sac:  Not visualized  Embryo:  Not visualized  Maternal uterus/adnexae: Uterus is  retroverted. No adnexal masses. Small corpus luteal cyst on the right. Trace free fluid near the fundus of the gallbladder.  IMPRESSION: No intrauterine pregnancy visualized. Differential considerations would include early intrauterine pregnancy too early to visualize, spontaneous abortion, or occult ectopic pregnancy. Recommend close clinical followup and serial quantitative beta HCGs and ultrasounds.   Electronically Signed   By: Charlett Nose M.D.   On: 12/09/2014 10:18   US Ob Transvaginal  12/09/2014   CLINICAL DATA:  Cramping, vaginal bleeding.  EXAM: OBSTETRIC <14 WK [place] AND TRANSVAGINAL OB US  TECHNIQUE: Both transabdominal and transvaginal ultrasound examinations were performed  for complete evaluation of the gestation as well as the maternal uterus, adnexal regions, and pelvic cul-de-sac. Transvaginal technique was performed to assess early pregnancy.  COMPARISON:  None.  FINDINGS: Intrauterine gestational sac: Not visualized  Yolk sac:  Not visualized  Embryo:  Not visualized  Maternal uterus/adnexae: Uterus is retroverted. No adnexal masses. Small corpus luteal cyst on the right. Trace free fluid near the fundus of the gallbladder.  IMPRESSION: No intrauterine pregnancy visualized. Differential considerations would include early intrauterine pregnancy too early to visualize, spontaneous abortion, or occult ectopic pregnancy. Recommend close clinical followup and serial quantitative beta HCGs and ultrasounds.   Electronically Signed   By: Charlett Nose M.D.   On: 12/09/2014 10:18    MAU Course  Procedures  MDM: UA           CBC           Pelvic/Transvaginal US             Assessment and Plan   1. Threatened miscarriage in early pregnancy   2. Abdominal pain affecting pregnancy, antepartum   3. Vaginal bleeding in pregnancy, first trimester    PLAN: Discharge home in stable condition. Ectopic and SAB precautions. Follow-up Information    Follow up with [facility] [place]  MATERNITY ADMISSIONS In 2 days.   Why:  for repeat bloodwork or sooner as needede in emergencies   Contact information:   [address] [id number] mc [place] [address] [phone]        Medication List    STOP taking these medications        ibuprofen 600 MG tablet  Commonly known as:  ADVIL,MOTRIN     naproxen 500 MG tablet  Commonly known as:  NAPROSYN     Norgestimate-Ethinyl Estradiol Triphasic 0.18/0.215/0.25 MG-25 MCG tab  Commonly known as:  ORTHO TRI-CYCLEN LO      TAKE these medications        traMADol 50 MG tablet  Commonly known as:  ULTRAM  Take 1 tablet (50 mg total) by mouth every 6 (six) hours as needed for severe pain.       Dorathy Kinsman 12/16/2014, 4:31 PM

## 2014-12-19 ENCOUNTER — Other Ambulatory Visit: Payer: Medicaid Other

## 2014-12-27 ENCOUNTER — Encounter (HOSPITAL_COMMUNITY): Payer: Self-pay

## 2014-12-27 ENCOUNTER — Emergency Department (HOSPITAL_COMMUNITY)
Admission: EM | Admit: 2014-12-27 | Discharge: 2014-12-28 | Disposition: A | Discharge status: Home / Self Care | Payer: Medicaid Other | Attending: Emergency Medicine | Admitting: Emergency Medicine

## 2014-12-27 DIAGNOSIS — F329 Major depressive disorder, single episode, unspecified: Secondary | ICD-10-CM | POA: Insufficient documentation

## 2014-12-27 DIAGNOSIS — N390 Urinary tract infection, site not specified: Secondary | ICD-10-CM | POA: Insufficient documentation

## 2014-12-27 DIAGNOSIS — Z3202 Encounter for pregnancy test, result negative: Secondary | ICD-10-CM | POA: Diagnosis not present

## 2014-12-27 NOTE — ED Notes (Addendum)
Patient reports she noticed irritation 4 days PTA and began taking OTC medications to ease her urinary symptoms (including frequency).  She took a shower today and since then she has been unable to urinate this evening due to inflammation.  Recently suffered a miscarriage.

## 2014-12-28 LAB — URINALYSIS, ROUTINE W REFLEX MICROSCOPIC
GLUCOSE, UA: NEGATIVE mg/dL
Hgb urine dipstick: NEGATIVE
Ketones, ur: 40 mg/dL — AB
Nitrite: POSITIVE — AB
Protein, ur: 30 mg/dL — AB
SPECIFIC GRAVITY, URINE: 1.025 (ref 1.005–1.030)
UROBILINOGEN UA: 4 mg/dL — AB (ref 0.0–1.0)
pH: 5 (ref 5.0–8.0)

## 2014-12-28 LAB — URINE MICROSCOPIC-ADD ON

## 2014-12-28 LAB — PREGNANCY, URINE: Preg Test, Ur: NEGATIVE

## 2014-12-28 MED ORDER — CEPHALEXIN 500 MG PO CAPS: 500.0000 mg | ORAL_CAPSULE | Freq: Four times a day (QID) | ORAL | Status: DC

## 2014-12-28 MED ORDER — PHENAZOPYRIDINE HCL 100 MG PO TABS
100.0000 mg | ORAL_TABLET | Freq: Once | ORAL | Status: AC
  Administered 2014-12-28: 100 mg via ORAL
  Filled 2014-12-28: qty 1

## 2014-12-28 MED ORDER — CEPHALEXIN 500 MG PO CAPS
500.0000 mg | ORAL_CAPSULE | Freq: Once | ORAL | Status: AC
  Administered 2014-12-28: 500 mg via ORAL
  Filled 2014-12-28: qty 1

## 2014-12-28 MED ORDER — PHENAZOPYRIDINE HCL 200 MG PO TABS: 200.0000 mg | ORAL_TABLET | Freq: Three times a day (TID) | ORAL | Status: DC

## 2014-12-28 NOTE — ED Provider Notes (Signed)
CSN: [id number]     Arrival date & time 12/27/14  2304 History   First MD Initiated Contact with Patient 12/28/14 [phone]     Chief Complaint  Patient presents with  . Urinary Tract Infection     (Consider location/radiation/quality/duration/timing/severity/associated sxs/prior Treatment) Patient is a 29 y.o. female presenting with urinary tract infection. The history is provided by the patient. No language interpreter was used.  Urinary Tract Infection This is a new problem. The current episode started in the past 7 days. The problem occurs constantly. The problem has been gradually worsening. Pertinent negatives include no abdominal pain, chills, fever, nausea or vomiting. Nothing aggravates the symptoms. She has tried nothing for the symptoms.    Past Medical History  Diagnosis Date  . Depression     2008 on medication   Past Surgical History  Procedure Laterality Date  . No past surgeries     Family History  Problem Relation Age of Onset  . Hypertension Mother   . Hypertension Father   . Cancer Maternal Grandmother   . Diabetes Maternal Grandmother   . Cancer Paternal Grandmother   . Diabetes Paternal Grandmother    History  Substance Use Topics  . Smoking status: Never Smoker   . Smokeless tobacco: Not on file  . Alcohol Use: Yes     Comment: socially   OB History    Gravida Para Term Preterm AB TAB SAB Ectopic Multiple Living   Review of Systems  Constitutional: Negative for fever and chills.  Gastrointestinal: Negative for nausea, vomiting and abdominal pain.  Genitourinary: Positive for dysuria. Negative for hematuria, vaginal bleeding, vaginal discharge and vaginal pain.      Allergies  Review of patient's allergies indicates no known allergies.  Home Medications   Prior to Admission medications   Medication Sig Start Date End Date Taking? Authorizing Provider  azithromycin (ZITHROMAX) 500 MG tablet Take 2 tablets once 12/11/14    Reva Bores, MD  cephALEXin (KEFLEX) 500 MG capsule Take 1 capsule (500 mg total) by mouth 4 (four) times daily. 12/28/14   Roxy Horseman, PA-C  phenazopyridine (PYRIDIUM) 200 MG tablet Take 1 tablet (200 mg total) by mouth 3 (three) times daily. 12/28/14   Roxy Horseman, PA-C  traMADol (ULTRAM) 50 MG tablet Take 1 tablet (50 mg total) by mouth every 6 (six) hours as needed for severe pain. 12/09/14   Dorathy Kinsman, CNM   BP 126/86 mmHg  Pulse 72  Temp(Src) 98.1 F (36.7 C) (Oral)  Resp 18  Ht  (1.575 m)  Wt 130 lb (58.968 kg)  BMI 23.77 kg/m2  SpO2 98%  LMP 10/28/2014 (Exact Date)  Breastfeeding? Unknown Physical Exam  Constitutional: She is oriented to person, place, and time. She appears well-developed and well-nourished.  HENT:  Head: Normocephalic and atraumatic.  Eyes: Conjunctivae and EOM are normal.  Neck: Normal range of motion.  Cardiovascular: Normal rate, regular rhythm and normal heart sounds.   Pulmonary/Chest: Effort normal.  Abdominal: She exhibits no distension.  No focal abdominal tenderness, no RLQ tenderness or pain at McBurney's point, no RUQ tenderness or Murphy's sign, no left-sided abdominal tenderness, no fluid wave, or signs of peritonitis   Musculoskeletal: Normal range of motion.  Neurological: She is alert and oriented to person, place, and time.  Skin: Skin is dry.  Psychiatric: She has a normal mood and affect. Her behavior is normal. Judgment and  thought content normal.  Nursing note and vitals reviewed.   ED Course  Procedures (including critical care time) Results for orders placed or performed during the hospital encounter of 12/27/14  Pregnancy, urine (if pre-menopausal female) MHP  Result Value Ref Range   Preg Test, Ur NEGATIVE NEGATIVE  Urinalysis, Routine w reflex microscopic (if pt has temp above 100.47F)  Result Value Ref Range   Color, Urine RED (A) YELLOW   APPearance CLOUDY (A) CLEAR   Specific Gravity, Urine 1.025 1.005  - 1.030   pH 5.0 5.0 - 8.0   Glucose, UA NEGATIVE NEGATIVE mg/dL   Hgb urine dipstick NEGATIVE NEGATIVE   Bilirubin Urine MODERATE (A) NEGATIVE   Ketones, ur 40 (A) NEGATIVE mg/dL   Protein, ur 30 (A) NEGATIVE mg/dL   Urobilinogen, UA 4.0 (H) 0.0 - 1.0 mg/dL   Nitrite POSITIVE (A) NEGATIVE   Leukocytes, UA LARGE (A) NEGATIVE  Urine microscopic-add on  Result Value Ref Range   Squamous Epithelial / LPF FEW (A) RARE   WBC, UA TOO NUMEROUS TO COUNT <3 WBC/hpf   RBC / HPF 0-2 <3 RBC/hpf   Bacteria, UA FEW (A) RARE   Urine-Other MUCOUS PRESENT    US Ob Comp Less 14 Wks  12/09/2014   CLINICAL DATA:  Cramping, vaginal bleeding.  EXAM: OBSTETRIC <14 WK [place] AND TRANSVAGINAL OB US  TECHNIQUE: Both transabdominal and transvaginal ultrasound examinations were performed for complete evaluation of the gestation as well as the maternal uterus, adnexal regions, and pelvic cul-de-sac. Transvaginal technique was performed to assess early pregnancy.  COMPARISON:  None.  FINDINGS: Intrauterine gestational sac: Not visualized  Yolk sac:  Not visualized  Embryo:  Not visualized  Maternal uterus/adnexae: Uterus is retroverted. No adnexal masses. Small corpus luteal cyst on the right. Trace free fluid near the fundus of the gallbladder.  IMPRESSION: No intrauterine pregnancy visualized. Differential considerations would include early intrauterine pregnancy too early to visualize, spontaneous abortion, or occult ectopic pregnancy. Recommend close clinical followup and serial quantitative beta HCGs and ultrasounds.   Electronically Signed   By: Charlett Nose M.D.   On: 12/09/2014 10:18   US Ob Transvaginal  12/09/2014   CLINICAL DATA:  Cramping, vaginal bleeding.  EXAM: OBSTETRIC <14 WK [place] AND TRANSVAGINAL OB US  TECHNIQUE: Both transabdominal and transvaginal ultrasound examinations were performed for complete evaluation of the gestation as well as the maternal uterus, adnexal regions, and pelvic cul-de-sac. Transvaginal  technique was performed to assess early pregnancy.  COMPARISON:  None.  FINDINGS: Intrauterine gestational sac: Not visualized  Yolk sac:  Not visualized  Embryo:  Not visualized  Maternal uterus/adnexae: Uterus is retroverted. No adnexal masses. Small corpus luteal cyst on the right. Trace free fluid near the fundus of the gallbladder.  IMPRESSION: No intrauterine pregnancy visualized. Differential considerations would include early intrauterine pregnancy too early to visualize, spontaneous abortion, or occult ectopic pregnancy. Recommend close clinical followup and serial quantitative beta HCGs and ultrasounds.   Electronically Signed   By: Charlett Nose M.D.   On: 12/09/2014 10:18     Imaging Review No results found.   EKG Interpretation None      MDM   Final diagnoses:  UTI (lower urinary tract infection)   Patient with dysuria x 4 days.  Afebrile.  No vomiting.  Well appearing.  Abdomen soft and non-tender.  No vaginal complaints.    UA consistent with UTI.  Will treat with keflex and pyridium.  DC to home.  Return precautions  given.    Roxy Horsemanobert Mariadelcarmen Corella, PA-C 12/28/14 [id number]  Devoria AlbeIva Knapp, MD 12/28/14 [phone]

## 2014-12-28 NOTE — Discharge Instructions (Signed)

## 2015-09-20 NOTE — L&D Delivery Note (Addendum)
Kelli Bean, Kelli Bean Female, 29 y.o., 1986/04/29  Delivery Note At 12:30 PM a viable mal, "Kelli Bean" was delivered via Vaginal, Spontaneous Delivery (Presentation:LOA).  APGAR: 7, 9; weight unknown.  Placenta status: grossly normal and intact.  Cord:  Grossly normal.    Anesthesia: Epidural  Episiotomy:  None Lacerations: Superficial, hemostatic right Labial minora tear Suture Repair: None Est. Blood Loss (mL): 200  Mom to postpartum.  Baby to Couplet care / Skin to Skin.  Konrad FelixKULWA,Scotti Motter WAKURU, MD 05/15/2016, 2:17 PM

## 2015-11-30 ENCOUNTER — Encounter: Payer: Self-pay | Admitting: Obstetrics and Gynecology

## 2015-11-30 ENCOUNTER — Encounter: Payer: Self-pay | Admitting: *Deleted

## 2015-11-30 ENCOUNTER — Ambulatory Visit (INDEPENDENT_AMBULATORY_CARE_PROVIDER_SITE_OTHER): Payer: Medicaid Other | Admitting: *Deleted

## 2015-11-30 DIAGNOSIS — Z3201 Encounter for pregnancy test, result positive: Secondary | ICD-10-CM

## 2015-11-30 DIAGNOSIS — Z3490 Encounter for supervision of normal pregnancy, unspecified, unspecified trimester: Secondary | ICD-10-CM

## 2015-11-30 LAB — POCT PREGNANCY, URINE: PREG TEST UR: POSITIVE — AB

## 2015-12-09 LAB — OB RESULTS CONSOLE RPR: RPR: NONREACTIVE

## 2015-12-09 LAB — OB RESULTS CONSOLE GC/CHLAMYDIA
Chlamydia: NEGATIVE
Gonorrhea: NEGATIVE

## 2015-12-09 LAB — OB RESULTS CONSOLE HIV ANTIBODY (ROUTINE TESTING): HIV: NONREACTIVE

## 2016-01-24 ENCOUNTER — Inpatient Hospital Stay (HOSPITAL_COMMUNITY)
Admission: AD | Admit: 2016-01-24 | Discharge: 2016-01-24 | Disposition: A | Discharge status: Home / Self Care | Payer: Medicaid Other | Source: Ambulatory Visit | Attending: Obstetrics and Gynecology | Admitting: Obstetrics and Gynecology

## 2016-01-24 ENCOUNTER — Encounter (HOSPITAL_COMMUNITY): Payer: Self-pay | Admitting: *Deleted

## 2016-01-24 DIAGNOSIS — Z3A23 23 weeks gestation of pregnancy: Secondary | ICD-10-CM | POA: Diagnosis not present

## 2016-01-24 DIAGNOSIS — O23592 Infection of other part of genital tract in pregnancy, second trimester: Secondary | ICD-10-CM | POA: Diagnosis not present

## 2016-01-24 DIAGNOSIS — B3731 Acute candidiasis of vulva and vagina: Secondary | ICD-10-CM

## 2016-01-24 DIAGNOSIS — N898 Other specified noninflammatory disorders of vagina: Secondary | ICD-10-CM | POA: Diagnosis present

## 2016-01-24 DIAGNOSIS — B373 Candidiasis of vulva and vagina: Secondary | ICD-10-CM

## 2016-01-24 LAB — URINALYSIS, ROUTINE W REFLEX MICROSCOPIC
BILIRUBIN URINE: NEGATIVE
Glucose, UA: NEGATIVE mg/dL
Hgb urine dipstick: NEGATIVE
KETONES UR: NEGATIVE mg/dL
NITRITE: NEGATIVE
PH: 6.5 (ref 5.0–8.0)
Protein, ur: NEGATIVE mg/dL
Specific Gravity, Urine: 1.01 (ref 1.005–1.030)

## 2016-01-24 LAB — WET PREP, GENITAL
Clue Cells Wet Prep HPF POC: NONE SEEN
SPERM: NONE SEEN
Trich, Wet Prep: NONE SEEN

## 2016-01-24 LAB — URINE MICROSCOPIC-ADD ON
Bacteria, UA: NONE SEEN
RBC / HPF: NONE SEEN RBC/hpf (ref 0–5)

## 2016-01-24 MED ORDER — TERCONAZOLE 0.4 % VA CREA: 1.0000 | TOPICAL_CREAM | Freq: Every day | VAGINAL | Status: DC

## 2016-01-24 NOTE — MAU Note (Signed)
Pt presents to MAU with complaints of yeast infection. Denies any vaginal bleeding or cramping.

## 2016-01-24 NOTE — MAU Provider Note (Signed)
Kelli Bean is a 30 yo, G6P1031 at 23.1 wks presenting to MAU unannounced for vaginal itching that started two days ago.  Reports calling [facility] two days ago in attempts to be seen but was told there weren't any openings and to come to MAU should symptoms worsen over the weekend. Pt states she was uncertain what medicine to take so she took AZO with temporary relief but symptoms "became unbearable" today.  Next [facility]  visit 01/27/16 for Colposcopy and 02/01/16 for return OB visit.   History     Patient Active Problem List   Diagnosis Date Noted  . Chlamydia infection 12/16/2014  . History of ectopic pregnancy 05/23/2014  . History of 2 spontaneous abortions 08/09/2013    Chief Complaint  Patient presents with  . Vaginitis   HPI  OB History    Gravida Para Term Preterm AB TAB SAB Ectopic Multiple Living   6 1 1  3  2 1  1       Past Medical History  Diagnosis Date  . Depression     2008 on medication    Past Surgical History  Procedure Laterality Date  . No past surgeries      Family History  Problem Relation Age of Onset  . Hypertension Mother   . Hypertension Father   . Cancer Maternal Grandmother   . Diabetes Maternal Grandmother   . Cancer Paternal Grandmother   . Diabetes Paternal Grandmother     Social History  Substance Use Topics  . Smoking status: Never Smoker   . Smokeless tobacco: None  . Alcohol Use: Yes     Comment: socially    Allergies: No Known Allergies  Prescriptions prior to admission  Medication Sig Dispense Refill Last Dose  . Prenatal Vit-Fe Fumarate-FA (PRENATAL MULTIVITAMIN) TABS tablet Take 1 tablet by mouth daily at 12 noon.   01/24/2016 at Unknown time  . cephALEXin (KEFLEX) 500 MG capsule Take 1 capsule (500 mg total) by mouth 4 (four) times daily. (Patient not taking: Reported on 01/24/2016) 40 capsule 0   . phenazopyridine (PYRIDIUM) 200 MG tablet Take 1 tablet (200 mg total) by mouth 3 (three) times daily. (Patient not taking:  Reported on 01/24/2016) 6 tablet 0     ROS Physical Exam   Blood pressure 114/61, pulse 74, temperature 98.4 F (36.9 C), resp. rate 18, last menstrual period 08/15/2015, unknown if currently breastfeeding.   VE: closed/thick ;  Thick yellow ,curddish, discharge Doppler, 147 bpm No contractions     Results for orders placed or performed during the hospital encounter of 01/24/16 (from the past 24 hour(s))  Urinalysis, Routine w reflex microscopic ([facility])     Status: Abnormal   Collection Time: 01/24/16  1:50 PM  Result Value Ref Range   Color, Urine YELLOW YELLOW   APPearance HAZY (A) CLEAR   Specific Gravity, Urine 1.010 1.005 - 1.030   pH 6.5 5.0 - 8.0   Glucose, UA NEGATIVE NEGATIVE mg/dL   Hgb urine dipstick NEGATIVE NEGATIVE   Bilirubin Urine NEGATIVE NEGATIVE   Ketones, ur NEGATIVE NEGATIVE mg/dL   Protein, ur NEGATIVE NEGATIVE mg/dL   Nitrite NEGATIVE NEGATIVE   Leukocytes, UA LARGE (A) NEGATIVE  Urine microscopic-add on     Status: Abnormal   Collection Time: 01/24/16  1:50 PM  Result Value Ref Range   Squamous Epithelial / LPF 0-5 (A) NONE SEEN   WBC, UA 0-5 0 - 5 WBC/hpf   RBC / HPF NONE SEEN 0 -  5 RBC/hpf   Bacteria, UA NONE SEEN NONE SEEN   Urine-Other YEAST PRESENT   Wet prep, genital     Status: Abnormal   Collection Time: 01/24/16  2:20 PM  Result Value Ref Range   Yeast Wet Prep HPF POC PRESENT (A) NONE SEEN   Trich, Wet Prep NONE SEEN NONE SEEN   Clue Cells Wet Prep HPF POC NONE SEEN NONE SEEN   WBC, Wet Prep HPF POC MANY (A) NONE SEEN   Sperm NONE SEEN     Physical Exam  Constitutional: She is oriented to person, place, and time. She appears well-developed and well-nourished.  HENT:  Head: Normocephalic.  Eyes: Pupils are equal, round, and reactive to light.  Neck: Normal range of motion.  Cardiovascular: Normal rate and regular rhythm.   Respiratory: Effort normal.  GI: Soft. Bowel sounds are normal.  Genitourinary: Uterus is not tender.  Cervix exhibits no motion tenderness. Right adnexum displays no tenderness. Left adnexum displays no tenderness. Vaginal discharge found.  Musculoskeletal: Normal range of motion.  Neurological: She is alert and oriented to person, place, and time.  Skin: Skin is warm and dry.  Psychiatric: She has a normal mood and affect. Her behavior is normal.    ED Course  Assessment: IUP 23.1 wks  Vaginal Yeast infection Doppler, 147 bpm No contractions UA negative  Plan: Terazol p.v 7days DC home in stable condition   Alphonzo Severance CNM, MSN 01/24/2016 2:57 PM

## 2016-01-24 NOTE — Discharge Instructions (Signed)

## 2016-04-15 LAB — OB RESULTS CONSOLE GBS: STREP GROUP B AG: POSITIVE

## 2016-05-04 ENCOUNTER — Inpatient Hospital Stay (HOSPITAL_COMMUNITY)
Admission: AD | Admit: 2016-05-04 | Discharge: 2016-05-05 | Disposition: A | Discharge status: Home / Self Care | Payer: Medicaid Other | Source: Ambulatory Visit | Attending: Obstetrics and Gynecology | Admitting: Obstetrics and Gynecology

## 2016-05-04 DIAGNOSIS — Z3A37 37 weeks gestation of pregnancy: Secondary | ICD-10-CM | POA: Insufficient documentation

## 2016-05-04 DIAGNOSIS — O99343 Other mental disorders complicating pregnancy, third trimester: Secondary | ICD-10-CM | POA: Insufficient documentation

## 2016-05-04 DIAGNOSIS — O26893 Other specified pregnancy related conditions, third trimester: Secondary | ICD-10-CM | POA: Insufficient documentation

## 2016-05-04 DIAGNOSIS — W108XXA Fall (on) (from) other stairs and steps, initial encounter: Secondary | ICD-10-CM

## 2016-05-04 DIAGNOSIS — W109XXA Fall (on) (from) unspecified stairs and steps, initial encounter: Secondary | ICD-10-CM | POA: Insufficient documentation

## 2016-05-04 DIAGNOSIS — F329 Major depressive disorder, single episode, unspecified: Secondary | ICD-10-CM | POA: Insufficient documentation

## 2016-05-04 DIAGNOSIS — Z87891 Personal history of nicotine dependence: Secondary | ICD-10-CM | POA: Insufficient documentation

## 2016-05-04 NOTE — MAU Note (Signed)
Pt reports she was 4cm in office yesterday and today she fell down about 8 stairs on her bottom and back. Was having pinkish discharge prior to the fall and reports no increase since the fall about 2300.

## 2016-05-05 ENCOUNTER — Encounter (HOSPITAL_COMMUNITY): Payer: Self-pay | Admitting: *Deleted

## 2016-05-05 DIAGNOSIS — Z87891 Personal history of nicotine dependence: Secondary | ICD-10-CM | POA: Diagnosis not present

## 2016-05-05 DIAGNOSIS — W108XXA Fall (on) (from) other stairs and steps, initial encounter: Secondary | ICD-10-CM

## 2016-05-05 DIAGNOSIS — S3992XA Unspecified injury of lower back, initial encounter: Secondary | ICD-10-CM | POA: Diagnosis not present

## 2016-05-05 DIAGNOSIS — O9A213 Injury, poisoning and certain other consequences of external causes complicating pregnancy, third trimester: Secondary | ICD-10-CM

## 2016-05-05 DIAGNOSIS — Z3A37 37 weeks gestation of pregnancy: Secondary | ICD-10-CM | POA: Diagnosis not present

## 2016-05-05 DIAGNOSIS — O99343 Other mental disorders complicating pregnancy, third trimester: Secondary | ICD-10-CM | POA: Diagnosis not present

## 2016-05-05 DIAGNOSIS — O26893 Other specified pregnancy related conditions, third trimester: Secondary | ICD-10-CM | POA: Diagnosis not present

## 2016-05-05 DIAGNOSIS — F329 Major depressive disorder, single episode, unspecified: Secondary | ICD-10-CM | POA: Diagnosis not present

## 2016-05-05 DIAGNOSIS — W109XXA Fall (on) (from) unspecified stairs and steps, initial encounter: Secondary | ICD-10-CM | POA: Diagnosis not present

## 2016-05-05 LAB — CBC
HEMATOCRIT: 29.4 % — AB (ref 36.0–46.0)
HEMOGLOBIN: 10.2 g/dL — AB (ref 12.0–15.0)
MCH: 31 pg (ref 26.0–34.0)
MCHC: 34.7 g/dL (ref 30.0–36.0)
MCV: 89.4 fL (ref 78.0–100.0)
Platelets: 166 10*3/uL (ref 150–400)
RBC: 3.29 MIL/uL — ABNORMAL LOW (ref 3.87–5.11)
RDW: 13.2 % (ref 11.5–15.5)
WBC: 9.1 10*3/uL (ref 4.0–10.5)

## 2016-05-05 LAB — KLEIHAUER-BETKE STAIN
# Vials RhIg: 1
Fetal Cells %: 0 %
QUANTITATION FETAL HEMOGLOBIN: 0 mL

## 2016-05-05 LAB — PROTIME-INR
INR: 0.97
PROTHROMBIN TIME: 12.9 s (ref 11.4–15.2)

## 2016-05-05 LAB — AMNISURE RUPTURE OF MEMBRANE (ROM) NOT AT ARMC: Amnisure ROM: NEGATIVE

## 2016-05-05 LAB — APTT: aPTT: 26 seconds (ref 24–36)

## 2016-05-05 LAB — FIBRINOGEN: Fibrinogen: 449 mg/dL (ref 210–475)

## 2016-05-05 MED ORDER — LACTATED RINGERS IV BOLUS (SEPSIS)
1000.0000 mL | Freq: Once | INTRAVENOUS | Status: AC
  Administered 2016-05-05: 1000 mL via INTRAVENOUS

## 2016-05-05 NOTE — Discharge Instructions (Signed)

## 2016-05-05 NOTE — MAU Provider Note (Signed)
History     CSN: [id number]  Arrival date and time: 05/04/16 2346   First Provider Initiated Contact with Patient 05/05/16 0024      Chief Complaint  Patient presents with  . Fall   Kelli Bean is a 30 y.o. [phone] at 7668w5d who presents today after a fall. She states that around 2300 she slipped, and landed on her bottom. She then fell down about 5 stairs, and scratched her leg. She states that she is having cramping now. She rates her pain 5/10. She states that she feels "very wet right now", and she has had noticed that all day. She has had to change pants about 3x today. She was checked in the office and she was 4cm, and she has had some spotting since the check. No bright red vaginal bleeding. She states that the fetus has been moving normally.    Fall  The accident occurred 1 to 3 hours ago. The fall occurred while walking. She fell from a height of 3 to 5 ft. She landed on hard floor. There was no blood loss. The point of impact was the buttocks. The pain is present in the buttocks and back (abdominal cramping. ). The pain is at a severity of 5/10. Associated symptoms include abdominal pain. Pertinent negatives include no fever, nausea or vomiting.    Past Medical History:  Diagnosis Date  . Depression    2008 on medication    Past Surgical History:  Procedure Laterality Date  . NO PAST SURGERIES      Family History  Problem Relation Age of Onset  . Hypertension Mother   . Hypertension Father   . Cancer Maternal Grandmother   . Diabetes Maternal Grandmother   . Cancer Paternal Grandmother   . Diabetes Paternal Grandmother     Social History  Substance Use Topics  . Smoking status: Former Games developermoker  . Smokeless tobacco: Never Used  . Alcohol use Yes     Comment: socially    Allergies: No Known Allergies  Prescriptions Prior to Admission  Medication Sig Dispense Refill Last Dose  . buPROPion (WELLBUTRIN SR) 150 MG 12 hr tablet Take 150 mg by mouth 2  (two) times daily.   05/04/2016 at Unknown time  . Prenatal Vit-Fe Fumarate-FA (PRENATAL MULTIVITAMIN) TABS tablet Take 1 tablet by mouth daily at 12 noon.   05/04/2016 at Unknown time  . terconazole (TERAZOL 7) 0.4 % vaginal cream Place 1 applicator vaginally at bedtime. 45 g 0     Review of Systems  Constitutional: Negative for chills and fever.  Gastrointestinal: Positive for abdominal pain. Negative for constipation, diarrhea, nausea and vomiting.  Neurological: Positive for dizziness.   Physical Exam   Blood pressure 108/77, pulse 112, temperature 98.6 F (37 C), temperature source Oral, resp. rate 19, height 5\' 2"  (1.575 m), weight 151 lb (68.5 kg), last menstrual period 08/15/2015, SpO2 99 %, unknown if currently breastfeeding.  Physical Exam  Nursing note and vitals reviewed. Constitutional: She is oriented to person, place, and time. She appears well-developed and well-nourished. No distress.  HENT:  Head: Normocephalic.  Cardiovascular: Normal rate.   Respiratory: Effort normal.  GI: Soft. There is no tenderness. There is no rebound.  Neurological: She is alert and oriented to person, place, and time.  Skin: Skin is warm and dry.  Psychiatric: She has a normal mood and affect.   FHT: 125, moderate with 15x15 accels, no decels Toco: about every 3-4 mins   Results for  orders placed or performed during the hospital encounter of 05/04/16 (from the past 24 hour(s))  Amnisure rupture of membrane (rom)not at [facility]     Status: None   Collection Time: 05/05/16 12:41 AM  Result Value Ref Range   Amnisure ROM NEGATIVE   CBC     Status: Abnormal   Collection Time: 05/05/16  2:27 AM  Result Value Ref Range   WBC 9.1 4.0 - 10.5 K/uL   RBC 3.29 (L) 3.87 - 5.11 MIL/uL   Hemoglobin 10.2 (L) 12.0 - 15.0 g/dL   HCT 40.929.4 (L) 81.136.0 - 91.446.0 %   MCV 89.4 78.0 - 100.0 fL   MCH 31.0 26.0 - 34.0 pg   MCHC 34.7 30.0 - 36.0 g/dL   RDW 78.213.2 95.611.5 - 21.315.5 %   Platelets 166 150 - 400 K/uL   Fibrinogen     Status: None   Collection Time: 05/05/16  2:27 AM  Result Value Ref Range   Fibrinogen 449 210 - 475 mg/dL  Protime-INR     Status: None   Collection Time: 05/05/16  2:27 AM  Result Value Ref Range   Prothrombin Time 12.9 11.4 - 15.2 seconds   INR 0.97   APTT     Status: None   Collection Time: 05/05/16  2:27 AM  Result Value Ref Range   aPTT 26 24 - 36 seconds    MAU Course  Procedures  MDM  0151: D/W Dr. Richardson Doppole will give 1L bolus and get: PT, PTT, CBC, KB, Fibrinogen.  FHT: 125, moderate with 15x15 accels, no decels Toco; about every 2-3 mins   0314: No cervical change FHT 125, moderate with 15x15 accels, no decels Toco: about every 4-6 min contractions.   0319: D/W Dr. Richardson Doppole patient is ok for DC home.     Assessment and Plan   1. Fall down stairs, initial encounter   2. [redacted] weeks gestation of pregnancy    DC home Comfort measures reviewed  3rd Trimester precautions  Bleeding precautions Labor precautions  Fetal kick counts RX: none  Return to MAU as needed FU with OB as planned  Follow-up Information    Janine LimboSTRINGER,ARTHUR V, MD .   Specialty:  Obstetrics and Gynecology Contact information: [address] [place] [address] [phone]            Tawnya CrookHogan, Con Arganbright Donovan 05/05/2016, 12:25 AM

## 2016-05-15 ENCOUNTER — Encounter (HOSPITAL_COMMUNITY): Payer: Self-pay | Admitting: *Deleted

## 2016-05-15 ENCOUNTER — Inpatient Hospital Stay (HOSPITAL_COMMUNITY): Payer: Medicaid Other | Admitting: Anesthesiology

## 2016-05-15 ENCOUNTER — Inpatient Hospital Stay (HOSPITAL_COMMUNITY)
Admission: AD | Admit: 2016-05-15 | Discharge: 2016-05-17 | DRG: 775 | Disposition: A | Discharge status: Home / Self Care | Payer: Medicaid Other | Source: Ambulatory Visit | Attending: Obstetrics & Gynecology | Admitting: Obstetrics & Gynecology

## 2016-05-15 DIAGNOSIS — F418 Other specified anxiety disorders: Secondary | ICD-10-CM | POA: Diagnosis present

## 2016-05-15 DIAGNOSIS — Z3A38 38 weeks gestation of pregnancy: Secondary | ICD-10-CM | POA: Diagnosis not present

## 2016-05-15 DIAGNOSIS — Z8249 Family history of ischemic heart disease and other diseases of the circulatory system: Secondary | ICD-10-CM | POA: Diagnosis not present

## 2016-05-15 DIAGNOSIS — Z833 Family history of diabetes mellitus: Secondary | ICD-10-CM

## 2016-05-15 DIAGNOSIS — O9962 Diseases of the digestive system complicating childbirth: Secondary | ICD-10-CM | POA: Diagnosis present

## 2016-05-15 DIAGNOSIS — F32A Depression, unspecified: Secondary | ICD-10-CM

## 2016-05-15 DIAGNOSIS — O99344 Other mental disorders complicating childbirth: Secondary | ICD-10-CM | POA: Diagnosis present

## 2016-05-15 DIAGNOSIS — IMO0002 Reserved for concepts with insufficient information to code with codable children: Secondary | ICD-10-CM | POA: Diagnosis not present

## 2016-05-15 DIAGNOSIS — Z283 Underimmunization status: Secondary | ICD-10-CM

## 2016-05-15 DIAGNOSIS — O4202 Full-term premature rupture of membranes, onset of labor within 24 hours of rupture: Principal | ICD-10-CM | POA: Diagnosis present

## 2016-05-15 DIAGNOSIS — O9989 Other specified diseases and conditions complicating pregnancy, childbirth and the puerperium: Secondary | ICD-10-CM

## 2016-05-15 DIAGNOSIS — O99824 Streptococcus B carrier state complicating childbirth: Secondary | ICD-10-CM | POA: Diagnosis present

## 2016-05-15 DIAGNOSIS — B951 Streptococcus, group B, as the cause of diseases classified elsewhere: Secondary | ICD-10-CM | POA: Diagnosis present

## 2016-05-15 DIAGNOSIS — O9902 Anemia complicating childbirth: Secondary | ICD-10-CM | POA: Diagnosis present

## 2016-05-15 DIAGNOSIS — Z87891 Personal history of nicotine dependence: Secondary | ICD-10-CM | POA: Diagnosis not present

## 2016-05-15 DIAGNOSIS — K219 Gastro-esophageal reflux disease without esophagitis: Secondary | ICD-10-CM | POA: Diagnosis present

## 2016-05-15 DIAGNOSIS — F419 Anxiety disorder, unspecified: Secondary | ICD-10-CM | POA: Diagnosis present

## 2016-05-15 DIAGNOSIS — F329 Major depressive disorder, single episode, unspecified: Secondary | ICD-10-CM

## 2016-05-15 DIAGNOSIS — D649 Anemia, unspecified: Secondary | ICD-10-CM | POA: Diagnosis present

## 2016-05-15 DIAGNOSIS — O09899 Supervision of other high risk pregnancies, unspecified trimester: Secondary | ICD-10-CM

## 2016-05-15 LAB — CBC
HEMATOCRIT: 31.1 % — AB (ref 36.0–46.0)
HEMOGLOBIN: 10.3 g/dL — AB (ref 12.0–15.0)
MCH: 30 pg (ref 26.0–34.0)
MCHC: 33.1 g/dL (ref 30.0–36.0)
MCV: 90.7 fL (ref 78.0–100.0)
Platelets: 167 10*3/uL (ref 150–400)
RBC: 3.43 MIL/uL — ABNORMAL LOW (ref 3.87–5.11)
RDW: 13.3 % (ref 11.5–15.5)
WBC: 8 10*3/uL (ref 4.0–10.5)

## 2016-05-15 LAB — HIV ANTIBODY (ROUTINE TESTING W REFLEX): HIV SCREEN 4TH GENERATION: NONREACTIVE

## 2016-05-15 LAB — RPR: RPR Ser Ql: NONREACTIVE

## 2016-05-15 LAB — TYPE AND SCREEN
ABO/RH(D): B POS
ANTIBODY SCREEN: NEGATIVE

## 2016-05-15 MED ORDER — DIPHENHYDRAMINE HCL 50 MG/ML IJ SOLN: 12.5000 mg | INTRAMUSCULAR | Status: DC | PRN

## 2016-05-15 MED ORDER — ONDANSETRON HCL 4 MG/2ML IJ SOLN: 4.0000 mg | INTRAMUSCULAR | Status: DC | PRN

## 2016-05-15 MED ORDER — OXYCODONE-ACETAMINOPHEN 5-325 MG PO TABS
1.0000 | ORAL_TABLET | ORAL | Status: DC | PRN
  Administered 2016-05-15: 1 via ORAL
  Filled 2016-05-15: qty 1

## 2016-05-15 MED ORDER — LIDOCAINE HCL (PF) 1 % IJ SOLN
30.0000 mL | INTRAMUSCULAR | Status: DC | PRN
  Filled 2016-05-15: qty 30

## 2016-05-15 MED ORDER — FENTANYL CITRATE (PF) 100 MCG/2ML IJ SOLN: 100.0000 ug | INTRAMUSCULAR | Status: DC | PRN

## 2016-05-15 MED ORDER — PHENYLEPHRINE 40 MCG/ML (10ML) SYRINGE FOR IV PUSH (FOR BLOOD PRESSURE SUPPORT)
80.0000 ug | PREFILLED_SYRINGE | INTRAVENOUS | Status: DC | PRN
  Filled 2016-05-15: qty 5

## 2016-05-15 MED ORDER — ACETAMINOPHEN 325 MG PO TABS
650.0000 mg | ORAL_TABLET | ORAL | Status: DC | PRN
  Administered 2016-05-16 (×2): 650 mg via ORAL
  Filled 2016-05-15 (×2): qty 2

## 2016-05-15 MED ORDER — FENTANYL 2.5 MCG/ML BUPIVACAINE 1/10 % EPIDURAL INFUSION (WH - ANES)
14.0000 mL/h | INTRAMUSCULAR | Status: DC | PRN
  Administered 2016-05-15 (×2): 14 mL/h via EPIDURAL
  Filled 2016-05-15: qty 125

## 2016-05-15 MED ORDER — PENICILLIN G POTASSIUM 5000000 UNITS IJ SOLR
5.0000 10*6.[IU] | Freq: Once | INTRAVENOUS | Status: AC
  Administered 2016-05-15: 5 10*6.[IU] via INTRAVENOUS
  Filled 2016-05-15: qty 5

## 2016-05-15 MED ORDER — LACTATED RINGERS IV SOLN: 500.0000 mL | Freq: Once | INTRAVENOUS | Status: DC

## 2016-05-15 MED ORDER — SENNOSIDES-DOCUSATE SODIUM 8.6-50 MG PO TABS
2.0000 | ORAL_TABLET | ORAL | Status: DC
  Administered 2016-05-16 (×2): 2 via ORAL
  Filled 2016-05-15 (×2): qty 2

## 2016-05-15 MED ORDER — BUPROPION HCL ER (XL) 150 MG PO TB24
150.0000 mg | ORAL_TABLET | Freq: Every day | ORAL | Status: DC
  Administered 2016-05-16 – 2016-05-17 (×2): 150 mg via ORAL
  Filled 2016-05-15 (×4): qty 1

## 2016-05-15 MED ORDER — EPHEDRINE 5 MG/ML INJ
10.0000 mg | INTRAVENOUS | Status: DC | PRN
  Filled 2016-05-15: qty 4

## 2016-05-15 MED ORDER — COCONUT OIL OIL
1.0000 "application " | TOPICAL_OIL | Status: DC | PRN
  Administered 2016-05-16: 1 via TOPICAL

## 2016-05-15 MED ORDER — OXYTOCIN 40 UNITS IN LACTATED RINGERS INFUSION - SIMPLE MED
1.0000 m[IU]/min | INTRAVENOUS | Status: DC
  Administered 2016-05-15: 1 m[IU]/min via INTRAVENOUS

## 2016-05-15 MED ORDER — PRENATAL MULTIVITAMIN CH
1.0000 | ORAL_TABLET | Freq: Every day | ORAL | Status: DC
  Administered 2016-05-16 – 2016-05-17 (×2): 1 via ORAL
  Filled 2016-05-15 (×2): qty 1

## 2016-05-15 MED ORDER — PHENYLEPHRINE 40 MCG/ML (10ML) SYRINGE FOR IV PUSH (FOR BLOOD PRESSURE SUPPORT)
80.0000 ug | PREFILLED_SYRINGE | INTRAVENOUS | Status: DC | PRN
  Filled 2016-05-15: qty 10
  Filled 2016-05-15: qty 5

## 2016-05-15 MED ORDER — DIBUCAINE 1 % RE OINT: 1.0000 "application " | TOPICAL_OINTMENT | RECTAL | Status: DC | PRN

## 2016-05-15 MED ORDER — ZOLPIDEM TARTRATE 5 MG PO TABS: 5.0000 mg | ORAL_TABLET | Freq: Every evening | ORAL | Status: DC | PRN

## 2016-05-15 MED ORDER — BENZOCAINE-MENTHOL 20-0.5 % EX AERO: 1.0000 "application " | INHALATION_SPRAY | CUTANEOUS | Status: DC | PRN

## 2016-05-15 MED ORDER — DIPHENHYDRAMINE HCL 25 MG PO CAPS: 25.0000 mg | ORAL_CAPSULE | Freq: Four times a day (QID) | ORAL | Status: DC | PRN

## 2016-05-15 MED ORDER — FLEET ENEMA 7-19 GM/118ML RE ENEM: 1.0000 | ENEMA | RECTAL | Status: DC | PRN

## 2016-05-15 MED ORDER — ONDANSETRON HCL 4 MG PO TABS: 4.0000 mg | ORAL_TABLET | ORAL | Status: DC | PRN

## 2016-05-15 MED ORDER — OXYCODONE-ACETAMINOPHEN 5-325 MG PO TABS
2.0000 | ORAL_TABLET | ORAL | Status: DC | PRN
Start: 2016-05-15 — End: 2016-05-15

## 2016-05-15 MED ORDER — TETANUS-DIPHTH-ACELL PERTUSSIS 5-2.5-18.5 LF-MCG/0.5 IM SUSP: 0.5000 mL | Freq: Once | INTRAMUSCULAR | Status: DC

## 2016-05-15 MED ORDER — TERBUTALINE SULFATE 1 MG/ML IJ SOLN
0.2500 mg | Freq: Once | INTRAMUSCULAR | Status: DC | PRN
  Filled 2016-05-15: qty 1

## 2016-05-15 MED ORDER — SIMETHICONE 80 MG PO CHEW
80.0000 mg | CHEWABLE_TABLET | ORAL | Status: DC | PRN
Start: 2016-05-15 — End: 2016-05-17

## 2016-05-15 MED ORDER — WITCH HAZEL-GLYCERIN EX PADS: 1.0000 "application " | MEDICATED_PAD | CUTANEOUS | Status: DC | PRN

## 2016-05-15 MED ORDER — PENICILLIN G POTASSIUM 5000000 UNITS IJ SOLR
2.5000 10*6.[IU] | INTRAVENOUS | Status: DC
  Administered 2016-05-15 (×2): 2.5 10*6.[IU] via INTRAVENOUS
  Filled 2016-05-15 (×5): qty 2.5

## 2016-05-15 MED ORDER — ACETAMINOPHEN 325 MG PO TABS: 650.0000 mg | ORAL_TABLET | ORAL | Status: DC | PRN

## 2016-05-15 MED ORDER — LIDOCAINE HCL (PF) 1 % IJ SOLN
INTRAMUSCULAR | Status: DC | PRN
  Administered 2016-05-15: 6 mL via EPIDURAL
  Administered 2016-05-15: 4 mL via EPIDURAL

## 2016-05-15 MED ORDER — IBUPROFEN 600 MG PO TABS
600.0000 mg | ORAL_TABLET | Freq: Four times a day (QID) | ORAL | Status: DC
  Administered 2016-05-15 – 2016-05-17 (×8): 600 mg via ORAL
  Filled 2016-05-15 (×8): qty 1

## 2016-05-15 MED ORDER — ONDANSETRON HCL 4 MG/2ML IJ SOLN: 4.0000 mg | Freq: Four times a day (QID) | INTRAMUSCULAR | Status: DC | PRN

## 2016-05-15 MED ORDER — SOD CITRATE-CITRIC ACID 500-334 MG/5ML PO SOLN: 30.0000 mL | ORAL | Status: DC | PRN

## 2016-05-15 MED ORDER — LACTATED RINGERS IV SOLN
INTRAVENOUS | Status: DC
  Administered 2016-05-15 (×2): via INTRAVENOUS

## 2016-05-15 MED ORDER — OXYTOCIN BOLUS FROM INFUSION
500.0000 mL | Freq: Once | INTRAVENOUS | Status: AC
  Administered 2016-05-15: 500 mL/h via INTRAVENOUS

## 2016-05-15 MED ORDER — OXYTOCIN 40 UNITS IN LACTATED RINGERS INFUSION - SIMPLE MED
2.5000 [IU]/h | INTRAVENOUS | Status: DC
  Filled 2016-05-15: qty 1000

## 2016-05-15 MED ORDER — LACTATED RINGERS IV SOLN
500.0000 mL | INTRAVENOUS | Status: DC | PRN
  Administered 2016-05-15: 500 mL via INTRAVENOUS

## 2016-05-15 NOTE — Anesthesia Pain Management Evaluation Note (Signed)
  CRNA Pain Management Visit Note  Patient: Kelli ChacoElizabeth P Shaikh, 30 y.o., female  "Hello I am a member of the anesthesia team at [facility]. We have an anesthesia team available at all times to provide care throughout the hospital, including epidural management and anesthesia for C-section. I don't know your plan for the delivery whether it a natural birth, water birth, IV sedation, nitrous supplementation, doula or epidural, but we want to meet your pain goals."   1.Was your pain managed to your expectations on prior hospitalizations?   Yes   2.What is your expectation for pain management during this hospitalization?     Epidural  3.How can we help you reach that goal? epidural  Record the patient's initial score and the patient's pain goal.   Pain: 0  Pain Goal: 8 [facility] wants you to be able to say your pain was always managed very well.  Cephus ShellingBURGER,Teneka Malmberg 05/15/2016

## 2016-05-15 NOTE — Progress Notes (Signed)
Subjective: Postpartum Day 1: Vaginal delivery, Superficial, hemostatic right labial minora laceration. Patient up ad lib, reports no syncope or dizziness. Feeding: Breast. Contraceptive plan: Thinking about IUD.  May want to go home today, but would like to wait to hear what Peds has to say about baby's discharge.  Objective: Vital signs in last 24 hours: Temp:  [97.7 F (36.5 C)-98.6 F (37 C)] 97.7 F (36.5 C) (08/27 1954) Pulse Rate:  [62-84] 67 (08/27 1954) Resp:  [16-20] 20 (08/27 1954) BP: (114-144)/(66-87) 118/70 (08/27 1954) SpO2:  [99 %-100 %] 100 % (08/27 0750)  Physical Exam:  General: alert, cooperative and no distress. Lochia: appropriate. Uterine Fundus: firm. Perineum: Intact. DVT Evaluation: No evidence of DVT seen on physical exam. Negative Homan's sign. No cords or calf tenderness.   CBC Latest Ref Rng & Units 05/16/2016 05/15/2016 05/05/2016  WBC 4.0 - 10.5 K/uL 7.9 8.0 9.1  Hemoglobin 12.0 - 15.0 g/dL 1.6(X9.2(L) 10.3(L) 10.2(L)  Hematocrit 36.0 - 46.0 % 28.1(L) 31.1(L) 29.4(L)  Platelets 150 - 400 K/uL 141(L) 167 166     Assessment/Plan: Status post vaginal delivery day 1. Anemia w/o hemodynamic instability. H/O anxiety, stable on Wellbutrin. Rubella non immune - offer vaccine at discharge. Continue current care. Begin po Iron. Discharge today or tomorrow. Was adequately treated for GBS. Social Work consult and Contraception thinking about IUD.    Robyne AskewWILLIAMS, KIMBERLYCNM 05/16/2016, 6:13 AM

## 2016-05-15 NOTE — Progress Notes (Signed)
To BS from Triage via w/c for further eval of labor.

## 2016-05-15 NOTE — H&P (Signed)
Kelli Bean is a 30 y.o. female, [phone] at weeks, presenting with SROM at 1am, clear fluid, with occasional UCs.  Denies bleeding, reports +FM.  Plans epidural.  Patient Active Problem List   Diagnosis Date Noted  . Normal labor 05/15/2016  . Positive GBS test 05/15/2016  . Anxiety disorder 05/15/2016  . Rubella non-immune status, antepartum 05/15/2016  . ASCUS with positive high risk HPV--plan colpo pp 05/15/2016  . Depression 05/15/2016  . History of ectopic pregnancy 05/23/2014  . 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   EDC of 05/23/16 was established by LMP..   Anatomy scan:  20 weeks, with bilateral CP cysts and a posterior placenta.   Additional [place] evaluations:   32 1/7 weeks:  EFW 2089 gm, 57%ile, AFI 12.35, 35%ile, vtx, cervix 4.16, resolution of CP cysts. Significant prenatal events: Significant anxiety/depression during pregnancy, on Wellbutrin with benefit.  Received counseling via [facility].   Admitted to MJ use in early pregnancy, reports cessation. GBS positive on urine culture.   Seen in MAU 05/04/16 after a fall, no issues.  ASCUS with HRHPV during pregnancy--colpo on 01/27/16, plan repeat PP. Last evaluation:  05/09/16--cervix 3 cm, 60%, vtx, -2, BP 92/60, weight 153.    OB History    Gravida Para Term Preterm AB Living   6 1 1   4 1    SAB TAB Ectopic Multiple Live Births   3   1   1     2007--SAB 2009--SAB 2012--SAB 2013--Ectopic, treated with MTX 2015--SVB, 39 weeks, 6 lbs, female, epidural, delivered with [facility].  Past Medical History:  Diagnosis Date  . Depression    2008 on medication   Past Surgical History:  Procedure Laterality Date  . NO PAST SURGERIES     Family History: family history includes Cancer in her maternal grandmother and paternal grandmother; Diabetes in her maternal grandmother and paternal grandmother; Hypertension in her  father and mother.   Social History:  reports that she has quit smoking. She has never used smokeless tobacco. She reports that she drinks alcohol. She reports that she does not use drugs.  Patient is African American, of the [place] faith, college-educated, employed as a Production assistant, radio. She is single, with FOB present and involved.   Prenatal Transfer Tool  Maternal Diabetes: No Genetic Screening: Normal Quad screen Maternal Ultrasounds/Referrals: Abnormal:  Findings:   Isolated choroid plexus cyst bilaterally on anatomy US, then resolved on f/u US. Fetal Ultrasounds or other Referrals:  None Maternal Substance Abuse:  Yes:  Type: Marijuana in early pregnancy Significant Maternal Medications:  Meds include: Other: Wellbutrin Significant Maternal Lab Results: Lab values include: Group B Strep positive  TDAP 02/29/16 Flu NA  ROS:  Leaking fluid, occasional UCs, +FM  No Known Allergies   Dilation: 3.5 Effacement (%): 80 Station: -2 Exam by:: V.Amarion Portell,CNM Blood pressure (!) 114/46, pulse 74, temperature 98.2 F (36.8 C), temperature source Oral, resp. rate 16, height 5\' 2"  (1.575 m), weight 71.2 kg (157 lb), last menstrual period 08/15/2015, unknown if currently breastfeeding.  Chest clear Heart RRR without murmur Abd gravid, NT, FH 39 cm Pelvic: As above, posterior, leaking clear fluid, forewaters palpated Ext: WNL  FHR: Category 1 UCs:  Occasional, mild  Prenatal labs: ABO, Rh:  B+ Antibody:  Neg Rubella:  Non-immune RPR:   NR HBsAg:   Neg HIV:   NR GBS:  Positive  Sickle cell/Hgb electrophoresis:  AA Pap:  ASCUS with HRHPV 11/2015--colpo done, plan repeat PP GC:  Negative 12/09/15 Chlamydia:  Negative 12/09/15 Genetic screenings:  Quad screen WNL Glucola:  WNL Other:   Hgb 12.2 at NOB, 11.1 at 28 weeks    Assessment/Plan: IUP at 38 1/7 weeks SROM at 1am, early labor GBS positive Rubella non-immune Anxiety dx Depression Hx ASCUS with HRHPV  Plan: Admit to  Birthing Suite per consult with Dr. Su Hiltoberts Routine [facility] orders Pain med/epidural prn PCN G for GBS prophylaxis  Continue Wellbutrin 150 mg extended release daily.  Kelli Bean, VICKICNM, [place] 05/15/2016, 3:08 AM

## 2016-05-15 NOTE — Anesthesia Preprocedure Evaluation (Signed)
Anesthesia Evaluation  Patient identified by MRN, date of birth, ID band Patient awake    Reviewed: Allergy & Precautions, NPO status , Patient's Chart, lab work & pertinent test results  History of Anesthesia Complications Negative for: history of anesthetic complications  Airway Mallampati: II  TM Distance: >3 FB Neck ROM: Full    Dental  (+) Teeth Intact   Pulmonary former smoker,    Pulmonary exam normal breath sounds clear to auscultation       Cardiovascular negative cardio ROS   Rhythm:Regular Rate:Normal     Neuro/Psych PSYCHIATRIC DISORDERS Depression negative neurological ROS     GI/Hepatic Neg liver ROS, GERD  Controlled,  Endo/Other  negative endocrine ROS  Renal/GU negative Renal ROS     Musculoskeletal   Abdominal   Peds  Hematology negative hematology ROS (+)   Anesthesia Other Findings   Reproductive/Obstetrics (+) Pregnancy                            Anesthesia Physical Anesthesia Plan  ASA: II  Anesthesia Plan: Epidural   Post-op Pain Management:    Induction:   Airway Management Planned:   Additional Equipment:   Intra-op Plan:   Post-operative Plan:   Informed Consent: I have reviewed the patients History and Physical, chart, labs and discussed the procedure including the risks, benefits and alternatives for the proposed anesthesia with the patient or authorized representative who has indicated his/her understanding and acceptance.     Plan Discussed with:   Anesthesia Plan Comments: (I have discussed risks of neuraxial anesthesia including but not limited to infection, bleeding, nerve injury, back pain, headache, seizures, and failure of block. Patient denies bleeding disorders and is not currently anticoagulated. Labs have been reviewed. Risks and benefits discussed. All patient's questions answered.   Hgb 10.3 Hct 31.1 Platelets 167)         Anesthesia Quick Evaluation

## 2016-05-15 NOTE — Lactation Note (Addendum)
This note was copied from a baby's chart. Lactation Consultation Note  Patient Name: Kelli Bean WUJWJ'XToday's Date: 05/15/2016 Reason for consult: Initial assessment Baby at 6 hr of life. Mom reports bf is going well. She denies breast or nipple pain, voiced no concerns. Discussed baby behavior, feeding frequency, baby belly size, voids, wt loss, breast changes, and nipple care. She stated she can manually express and has spoon in room. Given lactation handouts. Aware of OP services and support group.   Maternal Data Has patient been taught Hand Expression?: Yes Does the patient have breastfeeding experience prior to this delivery?: Yes  Feeding Feeding Type: Breast Fed  LATCH Score/Interventions Latch: Grasps breast easily, tongue down, lips flanged, rhythmical sucking.  Audible Swallowing: None Intervention(s): Skin to skin;Hand expression  Type of Nipple: Everted at rest and after stimulation  Comfort (Breast/Nipple): Soft / non-tender     Hold (Positioning): Assistance needed to correctly position infant at breast and maintain latch. Intervention(s): Breastfeeding basics reviewed;Support Pillows;Position options;Skin to skin  LATCH Score: 7  Lactation Tools Discussed/Used WIC Program: No   Consult Status Consult Status: Follow-up Date: 05/16/16 Follow-up type: In-patient    Rulon Eisenmengerlizabeth E Elsye Mccollister 05/15/2016, 6:40 PM

## 2016-05-15 NOTE — Progress Notes (Signed)
Kelli Bean is a 30 y.o. [phone] at 5094w1d for  rupture of membranes  Subjective: Patient comfortable with epidural.    Objective: BP 130/87   Pulse 72   Temp 98.6 F (37 C) (Oral)   Resp 18   Ht 5\' 2"  (1.575 m)   Wt 71.2 kg (157 lb)   LMP 08/15/2015   SpO2 100%   BMI 28.72 kg/m  No intake/output data recorded. No intake/output data recorded.  FHT:  FHR: 120 bpm, variability: moderate,  accelerations:  Present,  decelerations:  Present Variable and earlies.  UC:   irregular, every 3- 4 minutes SVE:   Dilation: 5 Effacement (%): 90 Station: -1 Exam by:: Dr.Falesha Schommer  Labs: Lab Results  Component Value Date   WBC 8.0 05/15/2016   HGB 10.3 (L) 05/15/2016   HCT 31.1 (L) 05/15/2016   MCV 90.7 05/15/2016   PLT 167 05/15/2016    Assessment / Plan: Augmentation of labor, progressing well  Labor: On pitocin for labor augmentation, with sone variable and early decels Fetal Wellbeing:  Category II, if recurrent variables will give amnioinfusion, stop pitocin.   Pain Control:  Epidural I/D:  n/a Anticipated MOD:  NSVD  Kelli Bean 05/15/2016, 9:06 AM

## 2016-05-15 NOTE — MAU Note (Signed)
Water broke about ago. Mild cramping.

## 2016-05-15 NOTE — Anesthesia Procedure Notes (Signed)
Epidural Patient location during procedure: OB Start time: 05/15/2016 7:30 AM End time: 05/15/2016 7:32 AM  Staffing Anesthesiologist: Linton RumpALLAN, Shterna Laramee DICKERSON Performed: anesthesiologist   Preanesthetic Checklist Completed: patient identified, surgical consent, pre-op evaluation, timeout performed, IV checked, risks and benefits discussed and monitors and equipment checked  Epidural Patient position: sitting Prep: site prepped and draped and DuraPrep Patient monitoring: continuous pulse ox and blood pressure Approach: midline Location: L2-L3 Injection technique: LOR air  Needle:  Needle type: Tuohy  Needle gauge: 17 G Needle length: 9 cm and 9 Needle insertion depth: 5 cm cm Catheter type: closed end flexible Catheter size: 19 Gauge Catheter at skin depth: 10 cm Test dose: negative  Assessment Events: blood not aspirated, injection not painful, no injection resistance, negative IV test and no paresthesia  Additional Notes Reason for block:procedure for pain

## 2016-05-15 NOTE — Progress Notes (Signed)
  Subjective: Aware of more UCs, but not uncomfortable.  Objective: BP 121/64   Pulse 79   Temp 98.2 F (36.8 C) (Oral)   Resp 16   Ht 5\' 2"  (1.575 m)   Wt 71.2 kg (157 lb)   LMP 08/15/2015   BMI 28.72 kg/m  No intake/output data recorded. No intake/output data recorded.  FHT: Category 1 UC:   irregular, every 3-5 minutes, mild SVE:   4 cm, 75%, vtx, -2, cervix posterior Leaking clear fluid  Received 1st dose PCN at 0316.   Assessment:  Latent labor SROM at 1am GBS positive  Plan: Start pitocin augmentation. Epidural prn.  Kelli Bean, Kelli Bean CNM 05/15/2016, 5:36 AM

## 2016-05-16 LAB — CBC
HCT: 28.1 % — ABNORMAL LOW (ref 36.0–46.0)
HEMOGLOBIN: 9.2 g/dL — AB (ref 12.0–15.0)
MCH: 29.4 pg (ref 26.0–34.0)
MCHC: 32.7 g/dL (ref 30.0–36.0)
MCV: 89.8 fL (ref 78.0–100.0)
PLATELETS: 141 10*3/uL — AB (ref 150–400)
RBC: 3.13 MIL/uL — AB (ref 3.87–5.11)
RDW: 13.2 % (ref 11.5–15.5)
WBC: 7.9 10*3/uL (ref 4.0–10.5)

## 2016-05-16 MED ORDER — FERROUS SULFATE 325 (65 FE) MG PO TABS
325.0000 mg | ORAL_TABLET | Freq: Two times a day (BID) | ORAL | Status: DC
  Administered 2016-05-16 – 2016-05-17 (×3): 325 mg via ORAL
  Filled 2016-05-16 (×3): qty 1

## 2016-05-16 NOTE — Progress Notes (Signed)
UR chart review completed.  

## 2016-05-16 NOTE — Lactation Note (Signed)
This note was copied from a baby's chart. Lactation Consultation Note  Patient Name: Kelli Bean UEAVW'UToday's Date: 05/16/2016 Reason for consult: Follow-up assessment Baby at 27 hr of life. Mom reports over night baby was feeding well but he has done well this am and afternoon. She denies breast or nipple pain, voiced no concerns. She is aware of lactation services and support group. She will call at next feeding for a latch check.   Maternal Data    Feeding    LATCH Score/Interventions                      Lactation Tools Discussed/Used     Consult Status Consult Status: Follow-up Date: 05/16/16 Follow-up type: In-patient    Kelli Bean 05/16/2016, 4:35 PM

## 2016-05-16 NOTE — Lactation Note (Signed)
This note was copied from a baby's chart. Lactation Consultation Note  Patient Name: Boy Darcus Pesterlizabeth Littman ZOXWR'UToday's Date: 05/16/2016 Reason for consult: Follow-up assessment Baby at 32 hr of life and mom is requesting help because of sore nipples. She stated baby is slipping to the tip of the nipple. No skin break down noted at this time. Showed mom how to turn baby to her, pull baby in close, and lean back. Baby was able to get and maintain a deep latch. Mom reported this feeding felt much better. She requested coconut oil, reports given to RN.   Maternal Data    Feeding Feeding Type: Breast Fed Length of feed: 15 min  LATCH Score/Interventions Latch: Grasps breast easily, tongue down, lips flanged, rhythmical sucking.  Audible Swallowing: Spontaneous and intermittent  Type of Nipple: Everted at rest and after stimulation  Comfort (Breast/Nipple): Filling, red/small blisters or bruises, mild/mod discomfort  Problem noted: Mild/Moderate discomfort Interventions (Mild/moderate discomfort): Hand expression  Hold (Positioning): Assistance needed to correctly position infant at breast and maintain latch. Intervention(s): Position options;Support Pillows  LATCH Score: 8  Lactation Tools Discussed/Used     Consult Status Consult Status: Follow-up Date: 05/17/16 Follow-up type: In-patient    Rulon Eisenmengerlizabeth E Joscelyne Renville 05/16/2016, 9:30 PM

## 2016-05-16 NOTE — Anesthesia Postprocedure Evaluation (Signed)
Anesthesia Post Note  Patient: Kelli Bean  Procedure(s) Performed: * No procedures listed *  Patient location during evaluation: Mother Baby Anesthesia Type: Epidural Level of consciousness: awake and alert and oriented Pain management: pain level controlled Vital Signs Assessment: post-procedure vital signs reviewed and stable Respiratory status: spontaneous breathing and nonlabored ventilation Cardiovascular status: blood pressure returned to baseline Postop Assessment: no headache, no backache, patient able to bend at knees and no signs of nausea or vomiting Anesthetic complications: no     Last Vitals:  Vitals:   05/15/16 1954 05/16/16 0526  BP: 118/70 119/64  Pulse: 67 60  Resp: 20   Temp: 36.5 C 36.3 C    Last Pain:  Vitals:   05/16/16 0700  TempSrc:   PainSc: 2    Pain Goal: Patients Stated Pain Goal: 0 (05/15/16 0208)               [name],Dave

## 2016-05-16 NOTE — Progress Notes (Signed)
MOB was referred for history of depression/anxiety.  Referral is screened out by Clinical Social Worker because none of the following criteria appear to apply and  there are no reports impacting the pregnancy or her transition to the postpartum period.   -History of anxiety/depression during this pregnancy, or of post-partum depression.  - Diagnosis of anxiety and/or depression within last 3 years.-  - History of depression due to pregnancy loss/loss of child or -MOB's symptoms are currently being treated with medication and/or therapy. t anxiety/depression during pregnancy, on Wellbutrin with benefit.  Received counseling via [facility] (per OB notes). MOB has been active in care and managed with medication and therapy for anxiety (situational with relationship and finances).     Please contact the Clinical Social Worker if needs arise or upon MOB request.    Kelli EmoryHannah Hyden Soley LCSW, MSW Clinical Social Work: System Insurance underwriterWide Float Coverage for [employer] NICU Clinical social worker [phone]

## 2016-05-17 MED ORDER — IBUPROFEN 600 MG PO TABS: 600.0000 mg | ORAL_TABLET | Freq: Four times a day (QID) | ORAL | 0 refills | Status: DC

## 2016-05-17 NOTE — Discharge Instructions (Signed)

## 2016-05-17 NOTE — Discharge Summary (Signed)
  Obstetric Discharge Summary  Reason for Admission: onset of labor and rupture of membranes on 05/15/16 Prenatal Procedures: none Intrapartum Procedures: spontaneous vaginal delivery by 05/15/16 on Dr Sallye OberKulwa Postpartum Procedures: none Complications-Operative and Postpartum: none  Hemoglobin  Date Value Ref Range Status  05/16/2016 9.2 (L) 12.0 - 15.0 g/dL Final   HCT  Date Value Ref Range Status  05/16/2016 28.1 (L) 36.0 - 46.0 % Final    Discharge Diagnoses: Term Pregnancy-delivered  Physical exam:   General: normal Lochia: appropriate Uterine Fundus: 0/2 firm non-tender  Extremities: No evidence of DVT seen on physical exam. Edema minimal   Hospital course: uncomplicated  Date: 05/17/2016 Activity: unrestricted Diet: routine Medications: Ibuprofen Condition: stable  Breastfeeding:   Yes.   Contraception:  undecided: considering Mirena  Instructions: refer to practice specific booklet Discharge to: home   Newborn Data:   Baby female Name: Kirstie MirzaKairo  Jahaad Penado A MD 05/17/2016, 9:39 AM

## 2016-05-17 NOTE — Lactation Note (Signed)
This note was copied from a baby's chart. Lactation Consultation Note: Mother states that she was sore , but Lactation consultant came in last night and taught her how to get infant on the breast with a deeper latch. Breast are filling. Advised mother to pre pump for several mins before latching infant. Mother was given a harmony hand pump with instructions.  Mother advised to continue to breastfeed 8-12 times in 24 hours. Mother is aware of all Lactation support .   Patient Name: Kelli Bean ZOXWR'UToday's Date: 05/17/2016     Maternal Data    Feeding    LATCH Score/Interventions                      Lactation Tools Discussed/Used     Consult Status      Kelli Bean, Kelli Bean 05/17/2016, 9:53 AM

## 2016-10-09 ENCOUNTER — Encounter (HOSPITAL_COMMUNITY): Payer: Self-pay | Admitting: Emergency Medicine

## 2016-10-09 ENCOUNTER — Emergency Department (HOSPITAL_COMMUNITY): Payer: Self-pay

## 2016-10-09 ENCOUNTER — Emergency Department (HOSPITAL_COMMUNITY)
Admission: EM | Admit: 2016-10-09 | Discharge: 2016-10-09 | Disposition: A | Discharge status: Home / Self Care | Payer: Self-pay | Attending: Emergency Medicine | Admitting: Emergency Medicine

## 2016-10-09 DIAGNOSIS — Z79899 Other long term (current) drug therapy: Secondary | ICD-10-CM | POA: Insufficient documentation

## 2016-10-09 DIAGNOSIS — Z87891 Personal history of nicotine dependence: Secondary | ICD-10-CM | POA: Insufficient documentation

## 2016-10-09 DIAGNOSIS — Y929 Unspecified place or not applicable: Secondary | ICD-10-CM | POA: Insufficient documentation

## 2016-10-09 DIAGNOSIS — Y9301 Activity, walking, marching and hiking: Secondary | ICD-10-CM | POA: Insufficient documentation

## 2016-10-09 DIAGNOSIS — X501XXA Overexertion from prolonged static or awkward postures, initial encounter: Secondary | ICD-10-CM | POA: Insufficient documentation

## 2016-10-09 DIAGNOSIS — S93401A Sprain of unspecified ligament of right ankle, initial encounter: Secondary | ICD-10-CM | POA: Insufficient documentation

## 2016-10-09 DIAGNOSIS — Y999 Unspecified external cause status: Secondary | ICD-10-CM | POA: Insufficient documentation

## 2016-10-09 MED ORDER — IBUPROFEN 800 MG PO TABS: 800.0000 mg | ORAL_TABLET | Freq: Three times a day (TID) | ORAL | 0 refills | Status: DC

## 2016-10-09 MED ORDER — IBUPROFEN 800 MG PO TABS
800.0000 mg | ORAL_TABLET | Freq: Once | ORAL | Status: AC
  Administered 2016-10-09: 800 mg via ORAL
  Filled 2016-10-09: qty 1

## 2016-10-09 NOTE — Discharge Instructions (Signed)
Medications: ibuprofen  Treatment: You can take ibuprofen every 8 hours as needed for your pain. You can alternate with Tylenol as prescribed over-the-counter every 4 hours in between. Wear your brace at all times except when bathing. Ambulate with crutches at all times and began bearing partial weight as tolerated. Elevate your leg whenever you are not ambulating on it. Use ice 3-4 times daily alternating 20 minute 20 minutes off.  Follow-up: Please follow-up with the orthopedic doctor, Dr. Lequita HaltAluisio, as needed if your symptoms are not improving over the next 7-10 days. Please return to the emergency department if you develop any new or worsening symptoms.

## 2016-10-09 NOTE — ED Triage Notes (Signed)
Patient reports twisting right ankle. Patient state that cant bear weight on it.

## 2016-10-09 NOTE — ED Provider Notes (Signed)
[facility]-EMERGENCY DEPT Provider Note   CSN: [id number] Arrival date & time: 10/09/16  1020  By signing my name below, I, Kelli Bean, attest that this documentation has been prepared under the direction and in the presence of Kelli Ream, PA-C. Electronically Signed: Orpah Bean , ED Scribe. 10/03/16. 4:20 PM.   History   Chief Complaint Chief Complaint  Patient presents with  . Ankle Pain    HPI  Kelli Bean is a 31 y.o. female who presents to the Emergency Department complaining of gradually worsening, mild to moderate R ankle pain with sudden onset x1 day ago. Pt states that yesterday while walking, she tripped over a bed and twisted the R ankle. Pt has tried icing the ankle with no relief and states that the pain is exacerbated with ambulation, bearing weight and movement. Pt denies numbness, tingling, chest pain, foot pain, SOB, abdominal pain, nausea and vomiting. Of note, pt denies hx of kidney problems, known allergy to any medications.    The history is provided by the patient. No language interpreter was used.    Past Medical History:  Diagnosis Date  . Depression    2008 on medication    Patient Active Problem List   Diagnosis Date Noted  . Positive GBS test 05/15/2016  . Anxiety disorder 05/15/2016  . Rubella non-immune status, antepartum 05/15/2016  . ASCUS with positive high risk HPV--plan colpo pp 05/15/2016  . Depression 05/15/2016  . Vaginal delivery 05/15/2016  . History of ectopic pregnancy 05/23/2014  . 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 NO PAST SURGERIES      OB History    Gravida Para Term Preterm AB Living   6 2 2   4 2    SAB TAB Ectopic Multiple Live Births   3   1 0 2       Home Medications    Prior to Admission medications   Medication Sig Start Date End Date Taking? Authorizing Provider  buPROPion (WELLBUTRIN XL) 150 MG 24 hr tablet Take 150 mg by  mouth daily.    Historical Provider, MD  ibuprofen (ADVIL,MOTRIN) 800 MG tablet Take 1 tablet (800 mg total) by mouth 3 (three) times daily. 10/09/16   Emi Holes, PA-C  Prenatal Vit-Fe Fumarate-FA (PRENATAL MULTIVITAMIN) TABS tablet Take 1 tablet by mouth daily.     Historical Provider, MD    Family History Family History  Problem Relation Age of Onset  . Hypertension Mother   . Hypertension Father   . Cancer Maternal Grandmother   . Diabetes Maternal Grandmother   . Cancer Paternal Grandmother   . Diabetes Paternal Grandmother     Social History Social History  Substance Use Topics  . Smoking status: Former Games developer  . Smokeless tobacco: Never Used  . Alcohol use Yes     Comment: socially     Allergies   Patient has no known allergies.   Review of Systems Review of Systems  Respiratory: Negative for shortness of breath.   Cardiovascular: Negative for chest pain.  Gastrointestinal: Negative for abdominal pain, nausea and vomiting.  Musculoskeletal: Positive for arthralgias (R ankle).  Neurological: Negative for numbness.     Physical Exam Updated Vital Signs BP 108/73 (BP Location: Right Arm)   Pulse 83   Temp 98 F (36.7 C) (Oral)   Resp 18   Ht 5\' 2"  (1.575 m)   Wt 62.6 kg   LMP 10/04/2016  SpO2 98%   BMI 25.24 kg/m   Physical Exam  Constitutional: She appears well-developed and well-nourished. No distress.  HENT:  Head: Normocephalic and atraumatic.  Mouth/Throat: Oropharynx is clear and moist. No oropharyngeal exudate.  Eyes: Conjunctivae are normal. Pupils are equal, round, and reactive to light. Right eye exhibits no discharge. Left eye exhibits no discharge. No scleral icterus.  Neck: Normal range of motion. Neck supple. No thyromegaly present.  Cardiovascular: Normal rate, regular rhythm, normal heart sounds and intact distal pulses.  Exam reveals no gallop and no friction rub.   No murmur heard. Pulmonary/Chest: Effort normal and breath  sounds normal. No stridor. No respiratory distress. She has no wheezes. She has no rales.  Abdominal: Soft. Bowel sounds are normal. She exhibits no distension. There is no tenderness. There is no rebound and no guarding.  Musculoskeletal: She exhibits no edema.       Right ankle: She exhibits no swelling, no ecchymosis and normal pulse. Tenderness. CF ligament tenderness found. No lateral malleolus, no medial malleolus, no head of 5th metatarsal and no proximal fibula tenderness found. Achilles tendon normal.  Tenderness inferior to medial malleolus and anterior dorsal ankle. Full ROM with plantar and dorsiflexion, normal sensation, DP pulse intact, capillary refill is less than 2 seconds.   Lymphadenopathy:    She has no cervical adenopathy.  Neurological: She is alert. Coordination normal.  Skin: Skin is warm and dry. No rash noted. She is not diaphoretic. No pallor.  Psychiatric: She has a normal mood and affect.  Nursing note and vitals reviewed.    ED Treatments / Results   DIAGNOSTIC STUDIES: Oxygen Saturation is 98% on RA, normal by my interpretation.   COORDINATION OF CARE: 11:35 AM-Discussed next steps with pt. Pt verbalized understanding and is agreeable with the plan.    Labs (all labs ordered are listed, but only abnormal results are displayed) Labs Reviewed - No data to display  EKG  EKG Interpretation None       Radiology Dg Ankle Complete Right  Result Date: 10/09/2016 CLINICAL DATA:  Pain after trauma EXAM: RIGHT ANKLE - COMPLETE 3+ VIEW COMPARISON:  None. FINDINGS: There is no evidence of fracture, dislocation, or joint effusion. There is no evidence of arthropathy or other focal bone abnormality. Soft tissues are unremarkable. IMPRESSION: Negative. Electronically Signed   By: Gerome Samavid  Williams III M.D   On: 10/09/2016 11:22    Procedures Procedures (including critical care time)  Medications Ordered in ED Medications  ibuprofen (ADVIL,MOTRIN) tablet 800  mg (800 mg Oral Given 10/09/16 1042)     Initial Impression / Assessment and Plan / ED Course  I have reviewed the triage vital signs and the nursing notes.  Pertinent labs & imaging results that were available during my care of the patient were reviewed by me and considered in my medical decision making (see chart for details).     R ankle X-Ray negative for obvious fracture or dislocation. Pain managed in ED with ibuprofen.   Home Care: Rest and elevate the injured ankle, apply ice intermittently. Use crutches without weight bearing until able to comfortable bear partial weight, then progress to full weight bearing as tolerated. ASO splint applied. See ortho prn. Discharge home with ibuprofen for pain control. Patient can alternate with Tylenol. Patient understands and agrees with plan. Patient vitals stable throughout ED course and discharged in satisfactory condition.  Final Clinical Impressions(s) / ED Diagnoses   Final diagnoses:  Sprain of right ankle, unspecified ligament,  initial encounter    New Prescriptions New Prescriptions   IBUPROFEN (ADVIL,MOTRIN) 800 MG TABLET    Take 1 tablet (800 mg total) by mouth 3 (three) times daily.   I personally performed the services described in this documentation, which was scribed in my presence. The recorded information has been reviewed and is accurate.     Emi Holes, PA-C 10/09/16 1136    Canary Brim Tegeler, MD 10/10/16 1013

## 2017-01-03 ENCOUNTER — Emergency Department (HOSPITAL_COMMUNITY): Payer: Self-pay

## 2017-01-03 ENCOUNTER — Encounter (HOSPITAL_COMMUNITY): Payer: Self-pay | Admitting: Emergency Medicine

## 2017-01-03 ENCOUNTER — Emergency Department (HOSPITAL_COMMUNITY)
Admission: EM | Admit: 2017-01-03 | Discharge: 2017-01-04 | Disposition: A | Discharge status: Home / Self Care | Payer: Self-pay | Attending: Emergency Medicine | Admitting: Emergency Medicine

## 2017-01-03 DIAGNOSIS — B9689 Other specified bacterial agents as the cause of diseases classified elsewhere: Secondary | ICD-10-CM

## 2017-01-03 DIAGNOSIS — Z79899 Other long term (current) drug therapy: Secondary | ICD-10-CM | POA: Insufficient documentation

## 2017-01-03 DIAGNOSIS — Z87891 Personal history of nicotine dependence: Secondary | ICD-10-CM | POA: Insufficient documentation

## 2017-01-03 DIAGNOSIS — N760 Acute vaginitis: Secondary | ICD-10-CM | POA: Insufficient documentation

## 2017-01-03 DIAGNOSIS — N390 Urinary tract infection, site not specified: Secondary | ICD-10-CM

## 2017-01-03 HISTORY — DX: Anxiety disorder, unspecified: F41.9

## 2017-01-03 LAB — WET PREP, GENITAL
SPERM: NONE SEEN
TRICH WET PREP: NONE SEEN
Yeast Wet Prep HPF POC: NONE SEEN

## 2017-01-03 LAB — URINALYSIS, ROUTINE W REFLEX MICROSCOPIC
BILIRUBIN URINE: NEGATIVE
Glucose, UA: NEGATIVE mg/dL
Hgb urine dipstick: NEGATIVE
Ketones, ur: NEGATIVE mg/dL
Nitrite: NEGATIVE
Protein, ur: NEGATIVE mg/dL
SPECIFIC GRAVITY, URINE: 1.032 — AB (ref 1.005–1.030)
pH: 5 (ref 5.0–8.0)

## 2017-01-03 LAB — PREGNANCY, URINE: PREG TEST UR: NEGATIVE

## 2017-01-03 MED ORDER — METRONIDAZOLE 500 MG PO TABS: 500.0000 mg | ORAL_TABLET | Freq: Two times a day (BID) | ORAL | 0 refills | Status: DC

## 2017-01-03 MED ORDER — SULFAMETHOXAZOLE-TRIMETHOPRIM 800-160 MG PO TABS: 1.0000 | ORAL_TABLET | Freq: Two times a day (BID) | ORAL | 0 refills | Status: AC

## 2017-01-03 NOTE — ED Provider Notes (Signed)
[facility]-EMERGENCY DEPT Provider Note   CSN: [id number] Arrival date & time: 01/03/17  1430     History   Chief Complaint Chief Complaint  Patient presents with  . Pelvic Pain    HPI Kelli Bean is a 31 y.o. female.  32 year old female presents with 24-hour history of pelvic cramping which is been progressively worse. Denies any vaginal bleeding or discharge. States that her last menstrual period was 4 weeks ago. Denies any dysuria or hematuria. No flank pain. No vomiting or diarrhea. Symptoms persistent and nothing makes them better or worse. Dr. use prior to arrival.      Past Medical History:  Diagnosis Date  . Anxiety   . Depression    2008 on medication    Patient Active Problem List   Diagnosis Date Noted  . Positive GBS test 05/15/2016  . Anxiety disorder 05/15/2016  . Rubella non-immune status, antepartum 05/15/2016  . ASCUS with positive high risk HPV--plan colpo pp 05/15/2016  . Depression 05/15/2016  . Vaginal delivery 05/15/2016  . History of ectopic pregnancy 05/23/2014  . 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 NO PAST SURGERIES      OB History    Gravida Para Term Preterm AB Living   SAB TAB Ectopic Multiple Live Births   3   1 0 2       Home Medications    Prior to Admission medications   Medication Sig Start Date End Date Taking? Authorizing Provider  acetaminophen (TYLENOL) 650 MG CR tablet Take 1,300 mg by mouth every 8 (eight) hours as needed for pain.   Yes Historical Provider, MD  busPIRone (BUSPAR) 5 MG tablet Take 5 mg by mouth 3 (three) times daily. 12/27/16  Yes Historical Provider, MD  citalopram (CELEXA) 10 MG tablet Take 10 mg by mouth at bedtime. Take  by mouth at bedtime x 2 weeks(started 4/10) then increase to  at bedtime. 12/27/16  Yes Historical Provider, MD  levonorgestrel (MIRENA) 20 MCG/24HR IUD 1 each by Intrauterine route once.    Yes Historical Provider, MD  ibuprofen (ADVIL,MOTRIN) 800 MG tablet Take 1 tablet (800 mg total) by mouth 3 (three) times daily. Patient not taking: Reported on 01/03/2017 10/09/16   Emi Holes, PA-C    Family History Family History  Problem Relation Age of Onset  . Hypertension Mother   . Hypertension Father   . Cancer Maternal Grandmother   . Diabetes Maternal Grandmother   . Cancer Paternal Grandmother   . Diabetes Paternal Grandmother     Social History Social History  Substance Use Topics  . Smoking status: Former Games developer  . Smokeless tobacco: Never Used  . Alcohol use Yes     Comment: socially     Allergies   Patient has no known allergies.   Review of Systems Review of Systems  All other systems reviewed and are negative.    Physical Exam Updated Vital Signs BP 122/78 (BP Location: Left Arm)   Pulse (!) 106   Temp 98.1 F (36.7 C) (Oral)   Resp 18   SpO2 96%   Physical Exam  Constitutional: She is oriented to person, place, and time. She appears well-developed and well-nourished.  Non-toxic appearance. No distress.  HENT:  Head: Normocephalic and atraumatic.  Eyes: Conjunctivae, EOM and lids are normal. Pupils are equal, round, and reactive to light.  Neck: Normal  range of motion. Neck supple. No tracheal deviation present. No thyroid mass present.  Cardiovascular: Normal rate, regular rhythm and normal heart sounds.  Exam reveals no gallop.   No murmur heard. Pulmonary/Chest: Effort normal and breath sounds normal. No stridor. No respiratory distress. She has no decreased breath sounds. She has no wheezes. She has no rhonchi. She has no rales.  Abdominal: Soft. Normal appearance and bowel sounds are normal. She exhibits no distension. There is no tenderness. There is no rebound and no CVA tenderness.    Genitourinary: No bleeding in the vagina.  There is a foreign body in the vagina. Vaginal discharge found.  Genitourinary Comments: Strings from  IUD noted.  Musculoskeletal: Normal range of motion. She exhibits no edema or tenderness.  Neurological: She is alert and oriented to person, place, and time. She has normal strength. No cranial nerve deficit or sensory deficit. GCS eye subscore is 4. GCS verbal subscore is 5. GCS motor subscore is 6.  Skin: Skin is warm and dry. No abrasion and no rash noted.  Psychiatric: She has a normal mood and affect. Her speech is normal and behavior is normal.  Nursing note and vitals reviewed.    ED Treatments / Results  Labs (all labs ordered are listed, but only abnormal results are displayed) Labs Reviewed  WET PREP, GENITAL  PREGNANCY, URINE  HIV ANTIBODY (ROUTINE TESTING)  GC/CHLAMYDIA PROBE AMP ([place]) NOT AT [facility]    EKG  EKG Interpretation None       Radiology No results found.  Procedures Procedures (including critical care time)  Medications Ordered in ED Medications - No data to display   Initial Impression / Assessment and Plan / ED Course  I have reviewed the triage vital signs and the nursing notes.  Pertinent labs & imaging results that were available during my care of the patient were reviewed by me and considered in my medical decision making (see chart for details).     Patient with evidence of bacterial vaginosis as well as UTI. Will treat with Flagyl and Bactrim  Final Clinical Impressions(s) / ED Diagnoses   Final diagnoses:  None    New Prescriptions New Prescriptions   No medications on file     Lorre Nick, MD 01/03/17 2353

## 2017-01-03 NOTE — ED Triage Notes (Signed)
Pt c/o severe pelvic cramping. Pt denies vaginal bleeding. Pt states she called EMS last night and had panic attack r/t the pain, states she couldn't breathe because of the pelvic pain, EMS helped patient with anxiety but pt states pain continues. Pt took OTC with minimal relief. Pt eating sandwich while waiting for triage and pt advised to be NPO until evaluated.

## 2017-01-04 LAB — GC/CHLAMYDIA PROBE AMP (~~LOC~~) NOT AT ARMC
Chlamydia: NEGATIVE
NEISSERIA GONORRHEA: NEGATIVE

## 2017-01-04 NOTE — ED Notes (Signed)
Pt verbalized understanding of d/c information, importance of completing all antibiotics and avoiding alcohol use while taking flagyl.

## 2017-01-05 LAB — HIV ANTIBODY (ROUTINE TESTING W REFLEX): HIV Screen 4th Generation wRfx: NONREACTIVE

## 2017-06-30 ENCOUNTER — Encounter (HOSPITAL_COMMUNITY): Payer: Self-pay | Admitting: *Deleted

## 2017-06-30 ENCOUNTER — Emergency Department (HOSPITAL_COMMUNITY)
Admission: EM | Admit: 2017-06-30 | Discharge: 2017-06-30 | Disposition: A | Discharge status: Home / Self Care | Payer: Self-pay | Attending: Emergency Medicine | Admitting: Emergency Medicine

## 2017-06-30 DIAGNOSIS — Z87891 Personal history of nicotine dependence: Secondary | ICD-10-CM | POA: Insufficient documentation

## 2017-06-30 DIAGNOSIS — K0889 Other specified disorders of teeth and supporting structures: Secondary | ICD-10-CM | POA: Insufficient documentation

## 2017-06-30 DIAGNOSIS — Z79899 Other long term (current) drug therapy: Secondary | ICD-10-CM | POA: Insufficient documentation

## 2017-06-30 MED ORDER — PENICILLIN V POTASSIUM 500 MG PO TABS: 500.0000 mg | ORAL_TABLET | Freq: Four times a day (QID) | ORAL | 0 refills | Status: AC

## 2017-06-30 MED ORDER — KETOROLAC TROMETHAMINE 60 MG/2ML IM SOLN
30.0000 mg | Freq: Once | INTRAMUSCULAR | Status: AC
  Administered 2017-06-30: 30 mg via INTRAMUSCULAR
  Filled 2017-06-30: qty 2

## 2017-06-30 MED ORDER — PENICILLIN V POTASSIUM 500 MG PO TABS
500.0000 mg | ORAL_TABLET | Freq: Once | ORAL | Status: AC
  Administered 2017-06-30: 500 mg via ORAL
  Filled 2017-06-30: qty 1

## 2017-06-30 MED ORDER — CHLORHEXIDINE GLUCONATE 0.12 % MT SOLN: 15.0000 mL | Freq: Two times a day (BID) | OROMUCOSAL | 0 refills | Status: DC

## 2017-06-30 NOTE — ED Provider Notes (Signed)
[facility]-EMERGENCY DEPT Provider Note   CSN: [id number] Arrival date & time: 06/30/17  1433     History   Chief Complaint Chief Complaint  Patient presents with  . Dental Problem    HPI Kelli Bean is a 31 y.o. female who presents a with chief complaint acute onset, progressively worsening left upper dental pain. Pain began yesterday evening and she states she had difficulty sleeping. Pain is described as throbbing and sharp, making it difficult for her to swallow secondary to the pain. She denies fevers, chills, vomiting, SOB, CP. No sensitivity to hot or cold foods. She does have her wisdom teeth and states that the last time she was at a dentist she was told she needed 3 molars excised. She does not have a dentist at this time. She has tried Tylenol,tea bags, and garlic without significant relief of her symptoms.  The history is provided by the patient.    Past Medical History:  Diagnosis Date  . Anxiety   . Depression    2008 on medication    Patient Active Problem List   Diagnosis Date Noted  . Positive GBS test 05/15/2016  . Anxiety disorder 05/15/2016  . Rubella non-immune status, antepartum 05/15/2016  . ASCUS with positive high risk HPV--plan colpo pp 05/15/2016  . Depression 05/15/2016  . Vaginal delivery 05/15/2016  . History of ectopic pregnancy 05/23/2014  . 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 NO PAST SURGERIES      OB History    Gravida Para Term Preterm AB Living   SAB TAB Ectopic Multiple Live Births   3   1 0 2       Home Medications    Prior to Admission medications   Medication Sig Start Date End Date Taking? Authorizing Provider  acetaminophen (TYLENOL) 650 MG CR tablet Take 1,300 mg by mouth every 8 (eight) hours as needed for pain.    [provider]  busPIRone (BUSPAR) 5 MG tablet Take 5 mg by mouth 3 (three) times daily. 12/27/16   [provider]  chlorhexidine (PERIDEX) 0.12 % solution Use as directed 15 mLs in the mouth or throat 2 (two) times daily. 06/30/17   Laelia Angelo A, PA-C  citalopram (CELEXA) 10 MG tablet Take 10 mg by mouth at bedtime. Take  by mouth at bedtime x 2 weeks(started 4/10) then increase to  at bedtime. 12/27/16   [provider]  ibuprofen (ADVIL,MOTRIN) 800 MG tablet Take 1 tablet (800 mg total) by mouth 3 (three) times daily. Patient not taking: Reported on 01/03/2017 10/09/16   Emi Holes, PA-C  levonorgestrel (MIRENA) 20 MCG/24HR IUD 1 each by Intrauterine route once.    [provider]  metroNIDAZOLE (FLAGYL) 500 MG tablet Take 1 tablet (500 mg total) by mouth 2 (two) times daily. 01/03/17   Lorre Nick, MD  penicillin v potassium (VEETID) 500 MG tablet Take 1 tablet (500 mg total) by mouth 4 (four) times daily. 06/30/17 07/07/17  Jeanie Sewer, PA-C    Family History Family History  Problem Relation Age of Onset  . Hypertension Mother   . Hypertension Father   . Cancer Maternal Grandmother   . Diabetes Maternal Grandmother   . Cancer Paternal Grandmother   . Diabetes Paternal Grandmother     Social History Social History  Substance Use Topics  . Smoking status: Former  Smoker  . Smokeless tobacco: Never Used  . Alcohol use Yes     Comment: socially     Allergies   Patient has no known allergies.   Review of Systems Review of Systems  Constitutional: Negative for chills and fever.  HENT: Positive for dental problem and trouble swallowing. Negative for facial swelling and voice change.   Respiratory: Negative for shortness of breath.   Cardiovascular: Negative for chest pain.  All other systems reviewed and are negative.    Physical Exam Updated Vital Signs BP 132/79 (BP Location: Left Arm)   Pulse 78   Temp 98 F (36.7 C) (Oral)   Resp 18   LMP 06/05/2017   SpO2 100%   Physical Exam  Constitutional: She appears well-developed and  well-nourished. No distress.  HENT:  Head: Normocephalic and atraumatic.  Mouth/Throat: Oropharynx is clear and moist.  Left lower wisdom tooth partially erupted, surrounding erythema of the gingiva, no discharge. Upper and lower left wisdom teeth tender to percussion. There is a small skin tear of the buccal mucosa near the left lower wisdom tooth. Dentition otherwise appears generally healthy. Posterior oropharynx without tonsillar hypertrophy, uvular deviation, exudates, or erythema. Airway is patent. Nose trismus or sublingual abnormalities. No significant facial swelling.  Eyes: Pupils are equal, round, and reactive to light. Conjunctivae and EOM are normal. Right eye exhibits no discharge. Left eye exhibits no discharge.  Neck: Normal range of motion. Neck supple. No JVD present. No tracheal deviation present.  Cardiovascular: Normal rate, regular rhythm and normal heart sounds.   Pulmonary/Chest: Effort normal and breath sounds normal.  Abdominal: She exhibits no distension.  Musculoskeletal: She exhibits no edema.  Neurological: She is alert.  Skin: Skin is warm and dry. No erythema.  Psychiatric: She has a normal mood and affect. Her behavior is normal.  Nursing note and vitals reviewed.    ED Treatments / Results  Labs (all labs ordered are listed, but only abnormal results are displayed) Labs Reviewed - No data to display  EKG  EKG Interpretation None       Radiology No results found.  Procedures Procedures (including critical care time)  Medications Ordered in ED Medications  ketorolac (TORADOL) injection 30 mg (not administered)  penicillin v potassium (VEETID) tablet 500 mg (not administered)     Initial Impression / Assessment and Plan / ED Course  I have reviewed the triage vital signs and the nursing notes.  Pertinent labs & imaging results that were available during my care of the patient were reviewed by me and considered in my medical decision making  (see chart for details).    Patient with toothache. Afebrile, vital signs are stable. Her wisdom teeth are coming in on the left.  No gross abscess.  Exam unconcerning for Ludwig's angina or spread of infection.  Will treat with penicillin and pain medicine.  Urged patient to follow-up with dentist.  Discussed indications for return to the ED. Pt verbalized understanding of and agreement with plan and is safe for discharge home at this time.   Final Clinical Impressions(s) / ED Diagnoses   Final diagnoses:  Pain, dental    New Prescriptions New Prescriptions   CHLORHEXIDINE (PERIDEX) 0.12 % SOLUTION    Use as directed 15 mLs in the mouth or throat 2 (two) times daily.   PENICILLIN V POTASSIUM (VEETID) 500 MG TABLET    Take 1 tablet (500 mg total) by mouth 4 (four) times daily.     Michela Pitcher  A, PA-C 06/30/17 1817    Bethann Berkshire, MD 06/30/17 2303

## 2017-06-30 NOTE — Discharge Instructions (Signed)
Apply warm compresses to jaw throughout the day. Take antibiotic to completion. Alternate 600 mg of ibuprofen and [id number] mg of Tylenol every 3 hours as needed for pain. Do not exceed 4000 mg of Tylenol daily. Followup with a dentist is very important for ongoing evaluation and management of recurrent dental pain. Have her return to emergency department for emergent changing or worsening symptoms, such as throat tightness, fevers, or worsening facial swelling.

## 2017-06-30 NOTE — ED Triage Notes (Signed)
Pt reports L lower dental pain since yesterday.  Pt still has her wisdom teeth.  Pt reports she can feel a "cut" on the inside of her cheek where the bad tooth is.  Swelling noted.

## 2018-03-05 ENCOUNTER — Other Ambulatory Visit (HOSPITAL_COMMUNITY)
Admission: RE | Admit: 2018-03-05 | Discharge: 2018-03-05 | Disposition: A | Discharge status: Home / Self Care | Payer: Medicaid Other | Source: Ambulatory Visit | Attending: Advanced Practice Midwife | Admitting: Advanced Practice Midwife

## 2018-03-05 ENCOUNTER — Encounter: Payer: Self-pay | Admitting: Advanced Practice Midwife

## 2018-03-05 ENCOUNTER — Ambulatory Visit (INDEPENDENT_AMBULATORY_CARE_PROVIDER_SITE_OTHER): Payer: Medicaid Other | Admitting: Advanced Practice Midwife

## 2018-03-05 VITALS — BP 146/86 | HR 79 | Ht 62.0 in | Wt 142.0 lb

## 2018-03-05 DIAGNOSIS — Z30013 Encounter for initial prescription of injectable contraceptive: Secondary | ICD-10-CM

## 2018-03-05 DIAGNOSIS — Z30432 Encounter for removal of intrauterine contraceptive device: Secondary | ICD-10-CM

## 2018-03-05 DIAGNOSIS — Z3009 Encounter for other general counseling and advice on contraception: Secondary | ICD-10-CM

## 2018-03-05 DIAGNOSIS — Z309 Encounter for contraceptive management, unspecified: Secondary | ICD-10-CM | POA: Diagnosis not present

## 2018-03-05 DIAGNOSIS — Z01419 Encounter for gynecological examination (general) (routine) without abnormal findings: Secondary | ICD-10-CM | POA: Insufficient documentation

## 2018-03-05 DIAGNOSIS — Z0000 Encounter for general adult medical examination without abnormal findings: Secondary | ICD-10-CM | POA: Diagnosis present

## 2018-03-05 DIAGNOSIS — Z3042 Encounter for surveillance of injectable contraceptive: Secondary | ICD-10-CM

## 2018-03-05 MED ORDER — MEDROXYPROGESTERONE ACETATE 150 MG/ML IM SUSP
150.0000 mg | Freq: Once | INTRAMUSCULAR | Status: AC
  Administered 2018-03-05: 150 mg via INTRAMUSCULAR

## 2018-03-05 NOTE — Progress Notes (Signed)
GYNECOLOGY ANNUAL PREVENTATIVE CARE ENCOUNTER NOTE  Subjective:   Kelli Bean is a 32 y.o. [phone] female here for a routine annual gynecologic exam, and IUD removal.  Current complaints: none.   Denies abnormal vaginal bleeding, discharge, pelvic pain, problems with intercourse or other gynecologic concerns.   She states that for approx one month she has had spotting and cramping. She reports that she tried to feel for the IUD, and cannot feel the strings. She thinks she can feel the base of the IUD. She is interested in depo today.     Gynecologic History Patient's last menstrual period was 02/25/2018 (within days). Contraception: IUD Last Pap: 2014. Results were: normal Last mammogram: NA. Results were: NA  Obstetric History OB History  Gravida Para Term Preterm AB Living  6 2 2   4 2   SAB TAB Ectopic Multiple Live Births  3   1 0 2    # Outcome Date GA Lbr Len/2nd Weight Sex Delivery Anes PTL Lv  6 Term 05/15/16 6738w1d 11:13 / 00:17 7 lb 11.5 oz (3.5 kg) M Vag-Spont EPI  LIV  5 Term 03/16/14 7438w1d 12:02 / 00:33 6 lb 0.1 oz (2.725 kg) F Vag-Spont EPI  LIV     Birth Comments: Newborn Screen Barcode: [id number] Hgb, Normal, FA   4 Ectopic 2013 613w0d         3 SAB 2012 3978w0d         2 SAB 2009 2178w0d         1 SAB 2007            Past Medical History:  Diagnosis Date  . Anxiety   . Depression    2008 on medication    Past Surgical History:  Procedure Laterality Date  . NO PAST SURGERIES      Current Outpatient Medications on File Prior to Visit  Medication Sig Dispense Refill  . Multiple Vitamin (MULTIVITAMIN) capsule Take 1 capsule by mouth daily.    Marland Kitchen. levonorgestrel (MIRENA) 20 MCG/24HR IUD 1 each by Intrauterine route once.     No current facility-administered medications on file prior to visit.     No Known Allergies  Social History   Socioeconomic History  . Marital status: Single    Spouse name: Not on file  . Number of children: Not on file  .  Years of education: Not on file  . Highest education level: Not on file  Occupational History  . Not on file  Social Needs  . Financial resource strain: Not on file  . Food insecurity:    Worry: Not on file    Inability: Not on file  . Transportation needs:    Medical: Not on file    Non-medical: Not on file  Tobacco Use  . Smoking status: Former Games developermoker  . Smokeless tobacco: Never Used  Substance and Sexual Activity  . Alcohol use: Yes    Comment: socially  . Drug use: No  . Sexual activity: Never    Birth control/protection: None  Lifestyle  . Physical activity:    Days per week: Not on file    Minutes per session: Not on file  . Stress: Not on file  Relationships  . Social connections:    Talks on phone: Not on file    Gets together: Not on file    Attends religious service: Not on file    Active member of club or organization: Not on file    Attends meetings of  clubs or organizations: Not on file    Relationship status: Not on file  . Intimate partner violence:    Fear of current or ex partner: Not on file    Emotionally abused: Not on file    Physically abused: Not on file    Forced sexual activity: Not on file  Other Topics Concern  . Not on file  Social History Narrative  . Not on file    Family History  Problem Relation Age of Onset  . Hypertension Mother   . Hypertension Father   . Cancer Maternal Grandmother   . Diabetes Maternal Grandmother   . Cancer Paternal Grandmother   . Diabetes Paternal Grandmother     The following portions of the patient's history were reviewed and updated as appropriate: allergies, current medications, past family history, past medical history, past social history, past surgical history and problem list.  Review of Systems Pertinent items noted in HPI and remainder of comprehensive ROS otherwise negative.   Objective:  BP (!) 146/86   Pulse 79   Ht 5\' 2"  (1.575 m)   Wt 142 lb (64.4 kg)   LMP 02/25/2018 (Within Days)    Breastfeeding? No   BMI 25.97 kg/m  CONSTITUTIONAL: Well-developed, well-nourished female in no acute distress.  HENT:  Normocephalic, atraumatic, External right and left ear normal. Oropharynx is clear and moist EYES: Conjunctivae and EOM are normal. Pupils are equal, round, and reactive to light. No scleral icterus.  NECK: Normal range of motion, supple, no masses.  Normal thyroid.  SKIN: Skin is warm and dry. No rash noted. Not diaphoretic. No erythema. No pallor. NEUROLOGIC: Alert and oriented to person, place, and time. Normal reflexes, muscle tone coordination. No cranial nerve deficit noted. PSYCHIATRIC: Normal mood and affect. Normal behavior. Normal judgment and thought content. CARDIOVASCULAR: Normal heart rate noted, regular rhythm RESPIRATORY: Clear to auscultation bilaterally. Effort and breath sounds normal, no problems with respiration noted. BREASTS: Symmetric in size. No masses, skin changes, nipple drainage, or lymphadenopathy. ABDOMEN: Soft, normal bowel sounds, no distention noted.  No tenderness, rebound or guarding.  PELVIC: Normal appearing external genitalia; normal appearing vaginal mucosa and cervix.  No abnormal discharge noted.  Pap smear obtained.  Normal uterine size, no other palpable masses, no uterine or adnexal tenderness. MUSCULOSKELETAL: Normal range of motion. No tenderness.  No cyanosis, clubbing, or edema.  2+ distal pulses.   GYNECOLOGY OFFICE PROCEDURE NOTE  Kelli Bean is a 32 y.o. [phone] here for Liletta IUD removal. No GYN concerns.   IUD Removal  Patient identified, informed consent performed, consent signed.  Patient was in the dorsal lithotomy position, normal external genitalia was noted.  A speculum was placed in the patient's vagina, normal discharge was noted, no lesions. The cervix was visualized, no lesions, no abnormal discharge.  IUD base noted to be protruding through the cervical os. Gentle steady pressure used to remove the  IUD. The right portion of the T was slow to release from the lower uterine segment, but eventually it did. No bleeding noted. The IUD was removed in its entirety.   Patient tolerated the procedure well.    Patient will use depo for contraception/ Routine preventative health maintenance measures emphasized.   Assessment and Plan:  1. Well woman exam with routine gynecological exam - Cytology - PAP  2. Encounter for IUD removal  3. Contraception counseling  4. Initiation of depo-provera   Will follow up results of pap smear and manage accordingly. Routine preventative  health maintenance measures emphasized. Please refer to After Visit Summary for other counseling recommendations.

## 2018-03-05 NOTE — Addendum Note (Signed)
Addended by: Kathee DeltonHILLMAN, Shalondra Wunschel L on: 03/05/2018 04:24 PM   Modules accepted: Orders

## 2018-03-07 LAB — CYTOLOGY - PAP
Chlamydia: NEGATIVE
DIAGNOSIS: NEGATIVE
HPV (WINDOPATH): NOT DETECTED
Neisseria Gonorrhea: NEGATIVE

## 2018-03-08 NOTE — Addendum Note (Signed)
Addended by: Thressa ShellerHOGAN, HEATHER D on: 03/08/2018 10:47 AM   Modules accepted: Level of Service

## 2018-05-23 ENCOUNTER — Ambulatory Visit: Payer: Medicaid Other

## 2018-09-19 NOTE — L&D Delivery Note (Signed)
[facility]N [facility] Delivery Note  Kelli Bean is a 33 y.o. [id number] s/p spontaneous vaginal at [redacted]w[redacted]d. She was admitted for Labor Eval  ROM: 0h 38m with light mec GBS Status: Negative (05/20 0410) Maximum Maternal Temperature: Temp (24hrs), Avg:98.5 F (36.9 C), Min:98.5 F (36.9 C), Max:98.5 F (36.9 C)   Labor Progress: . Contractions started at home at 0830 today  . Admitted in active labor . AROM 1501 . Complete dilation achieved.   Delivery Date/Time: 02/22/2019 at 1729   Delivery: Called to room and patient was complete and pushing. Head delivered ROA. No nuchal cord present. Shoulder and body delivered in usual fashion. Infant with spontaneous cry, placed on mother's abdomen, dried and stimulated. Cord clamped x 2 after 1-minute delay, and cut by patient's mother.  Placenta delivered spontaneously with gentle cord traction. Fundus firm with massage and Pitocin. Labia, perineum, vagina, and cervix inspected and found to have bilateral periurethral abrasions.   Placenta: spontaneous , intact  Complications: none Lacerations: periurethral, no repair  EBL: 264 mL  Analgesia: Epidural anesthesia, though placed just prior to deliver  Postpartum Planning . Lactation  . AM CBC . Immunization?   Infant: Viable female  8/9  **g  Genia Hotter, M.D.  Family Medicine  PGY-1 02/22/2019 5:49 PM

## 2018-09-25 ENCOUNTER — Encounter: Payer: Self-pay | Admitting: Obstetrics and Gynecology

## 2018-09-25 ENCOUNTER — Other Ambulatory Visit (HOSPITAL_COMMUNITY)
Admission: RE | Admit: 2018-09-25 | Discharge: 2018-09-25 | Disposition: A | Discharge status: Home / Self Care | Payer: Medicaid Other | Source: Ambulatory Visit | Attending: Obstetrics and Gynecology | Admitting: Obstetrics and Gynecology

## 2018-09-25 ENCOUNTER — Ambulatory Visit (INDEPENDENT_AMBULATORY_CARE_PROVIDER_SITE_OTHER): Payer: Medicaid Other | Admitting: Obstetrics and Gynecology

## 2018-09-25 DIAGNOSIS — Z3480 Encounter for supervision of other normal pregnancy, unspecified trimester: Secondary | ICD-10-CM | POA: Diagnosis present

## 2018-09-25 DIAGNOSIS — Z3482 Encounter for supervision of other normal pregnancy, second trimester: Secondary | ICD-10-CM | POA: Diagnosis not present

## 2018-09-25 NOTE — Progress Notes (Signed)
Subjective:  Kelli Bean is a 33 y.o. [id number] at [redacted]w[redacted]d being seen today for her first OB visit. Certain LMP.  EDD by LMP.  No chronic medical problems or medications.   She is currently monitored for the following issues for this low-risk pregnancy and has History of 2 spontaneous abortions; History of ectopic pregnancy; Anxiety disorder; Depression; and Supervision of other normal pregnancy, antepartum on their problem list.  Patient reports occ HA.  Contractions: Not present. Vag. Bleeding: None.   . Denies leaking of fluid.   The following portions of the patient's history were reviewed and updated as appropriate: allergies, current medications, past family history, past medical history, past social history, past surgical history and problem list. Problem list updated.  Objective:   Vitals:   09/25/18 0903  BP: 95/63  Pulse: 83  Weight: 156 lb 1.6 oz (70.8 kg)    Fetal Status: Fetal Heart Rate (bpm): 145         General:  Alert, oriented and cooperative. Patient is in no acute distress.  Skin: Skin is warm and dry. No rash noted.   Cardiovascular: Normal heart rate noted  Respiratory: Normal respiratory effort, no problems with respiration noted  Abdomen: Soft, gravid, appropriate for gestational age. Pain/Pressure: Absent     Pelvic:  Cervical exam performed        Extremities: Normal range of motion.  Edema: None  Mental Status: Normal mood and affect. Normal behavior. Normal judgment and thought content.  Breast sym supple no nipple discharge or adenopathy  Urinalysis:      Assessment and Plan:  Pregnancy: [id number] at [redacted]w[redacted]d  1. Supervision of other normal pregnancy, antepartum Prenatal care and labs reviewed with pt. Genetic testing reviewed with pt. Declined - Obstetric Panel, Including HIV - Culture, OB Urine - Genetic Screening - Hemoglobinopathy evaluation - Cystic Fibrosis Mutation 97 - SMN1 COPY NUMBER ANALYSIS (SMA Carrier Screen) - CH[facility] AMB BABYSCRIPTS  OPT IN - [place] MFM OB COMP + 14 WK; Future  Preterm labor symptoms and general obstetric precautions including but not limited to vaginal bleeding, contractions, leaking of fluid and fetal movement were reviewed in detail with the patient. Please refer to After Visit Summary for other counseling recommendations.  Return in about 4 weeks (around 10/23/2018) for OB visit.   Hermina Staggers, MD

## 2018-09-25 NOTE — Progress Notes (Signed)
Pt presents for initial NOB visit c/o HA's and itchy rash on abdomen x 2 weeks.  Normal pap 02/2018

## 2018-09-25 NOTE — Patient Instructions (Signed)

## 2018-09-25 NOTE — Addendum Note (Signed)
Addended by: Dalphine HandingGARDNER, Reeves Musick L on: 09/25/2018 11:48 AM   Modules accepted: Orders

## 2018-09-26 LAB — CERVICOVAGINAL ANCILLARY ONLY
CHLAMYDIA, DNA PROBE: NEGATIVE
NEISSERIA GONORRHEA: NEGATIVE
TRICH (WINDOWPATH): NEGATIVE

## 2018-09-28 LAB — URINE CULTURE, OB REFLEX

## 2018-09-28 LAB — CULTURE, OB URINE

## 2018-10-02 ENCOUNTER — Encounter (HOSPITAL_COMMUNITY): Payer: Self-pay

## 2018-10-02 LAB — SMN1 COPY NUMBER ANALYSIS (SMA CARRIER SCREENING)

## 2018-10-02 LAB — OBSTETRIC PANEL, INCLUDING HIV
Antibody Screen: NEGATIVE
BASOS ABS: 0 10*3/uL (ref 0.0–0.2)
Basos: 0 %
EOS (ABSOLUTE): 0.1 10*3/uL (ref 0.0–0.4)
EOS: 1 %
HIV SCREEN 4TH GENERATION: NONREACTIVE
Hematocrit: 36.1 % (ref 34.0–46.6)
Hemoglobin: 12.6 g/dL (ref 11.1–15.9)
Hepatitis B Surface Ag: NEGATIVE
Immature Grans (Abs): 0 10*3/uL (ref 0.0–0.1)
Immature Granulocytes: 0 %
LYMPHS ABS: 1.5 10*3/uL (ref 0.7–3.1)
Lymphs: 18 %
MCH: 32.2 pg (ref 26.6–33.0)
MCHC: 34.9 g/dL (ref 31.5–35.7)
MCV: 92 fL (ref 79–97)
MONOS ABS: 0.6 10*3/uL (ref 0.1–0.9)
Monocytes: 8 %
NEUTROS ABS: 5.8 10*3/uL (ref 1.4–7.0)
NEUTROS PCT: 73 %
PLATELETS: 261 10*3/uL (ref 150–450)
RBC: 3.91 x10E6/uL (ref 3.77–5.28)
RDW: 13 % (ref 11.7–15.4)
RH TYPE: POSITIVE
RPR Ser Ql: NONREACTIVE
Rubella Antibodies, IGG: <0.9 index — ABNORMAL LOW (ref >0.99)
WBC: 8 10*3/uL (ref 3.4–10.8)

## 2018-10-02 LAB — CYSTIC FIBROSIS MUTATION 97: Interpretation: NOT DETECTED

## 2018-10-02 LAB — HEMOGLOBINOPATHY EVALUATION
HGB C: 0 %
HGB S: 0 %
HGB VARIANT: 0 %
Hemoglobin A2 Quantitation: 2.3 % (ref 1.8–3.2)
Hemoglobin F Quantitation: 0 % (ref 0.0–2.0)
Hgb A: 97.7 % (ref 96.4–98.8)

## 2018-10-09 ENCOUNTER — Ambulatory Visit (HOSPITAL_COMMUNITY)
Admission: RE | Admit: 2018-10-09 | Discharge: 2018-10-09 | Disposition: A | Discharge status: Home / Self Care | Payer: Medicaid Other | Source: Ambulatory Visit | Attending: Obstetrics and Gynecology | Admitting: Obstetrics and Gynecology

## 2018-10-09 ENCOUNTER — Other Ambulatory Visit (HOSPITAL_COMMUNITY): Payer: Self-pay | Admitting: *Deleted

## 2018-10-09 DIAGNOSIS — Z3A19 19 weeks gestation of pregnancy: Secondary | ICD-10-CM | POA: Diagnosis not present

## 2018-10-09 DIAGNOSIS — Z3480 Encounter for supervision of other normal pregnancy, unspecified trimester: Secondary | ICD-10-CM | POA: Diagnosis present

## 2018-10-09 DIAGNOSIS — Z363 Encounter for antenatal screening for malformations: Secondary | ICD-10-CM | POA: Diagnosis not present

## 2018-10-09 DIAGNOSIS — Z362 Encounter for other antenatal screening follow-up: Secondary | ICD-10-CM

## 2018-10-23 ENCOUNTER — Ambulatory Visit (INDEPENDENT_AMBULATORY_CARE_PROVIDER_SITE_OTHER): Payer: Medicaid Other | Admitting: Obstetrics & Gynecology

## 2018-10-23 VITALS — BP 107/71 | HR 81 | Wt 158.8 lb

## 2018-10-23 DIAGNOSIS — Z3480 Encounter for supervision of other normal pregnancy, unspecified trimester: Secondary | ICD-10-CM

## 2018-10-23 DIAGNOSIS — Z3482 Encounter for supervision of other normal pregnancy, second trimester: Secondary | ICD-10-CM

## 2018-10-23 NOTE — Progress Notes (Signed)
Pt is here for ROB. 107w6d.

## 2018-10-23 NOTE — Progress Notes (Signed)
   PRENATAL VISIT NOTE  Subjective:  Kelli Bean is a 33 y.o. [id number] at [redacted]w[redacted]d being seen today for ongoing prenatal care.  She is currently monitored for the following issues for this low-risk pregnancy and has History of 2 spontaneous abortions; History of ectopic pregnancy; Anxiety disorder; Depression; and Supervision of other normal pregnancy, antepartum on their problem list.  Patient reports no complaints.  Contractions: Not present. Vag. Bleeding: None.   . Denies leaking of fluid.   The following portions of the patient's history were reviewed and updated as appropriate: allergies, current medications, past family history, past medical history, past social history, past surgical history and problem list. Problem list updated.  Objective:   Vitals:   10/23/18 0935  BP: 107/71  Pulse: 81  Weight: 158 lb 12.8 oz (72 kg)    Fetal Status: Fetal Heart Rate (bpm): 140 Fundal Height: 21 cm       General:  Alert, oriented and cooperative. Patient is in no acute distress.  Skin: Skin is warm and dry. No rash noted.   Cardiovascular: Normal heart rate noted  Respiratory: Normal respiratory effort, no problems with respiration noted  Abdomen: Soft, gravid, appropriate for gestational age.  Pain/Pressure: Present     Pelvic: Cervical exam deferred        Extremities: Normal range of motion.  Edema: None  Mental Status: Normal mood and affect. Normal behavior. Normal judgment and thought content.   Assessment and Plan:  Pregnancy: [id number] at [redacted]w[redacted]d  1. Supervision of other normal pregnancy, antepartum [place] result was reviewed, she has f/u to complete anatomy survey in 2 weeks  Preterm labor symptoms and general obstetric precautions including but not limited to vaginal bleeding, contractions, leaking of fluid and fetal movement were reviewed in detail with the patient. Please refer to After Visit Summary for other counseling recommendations.  Return in about 4 weeks (around  11/20/2018).  Future Appointments  Date Time Provider Department Center  11/06/2018  8:15 AM [facility] [place] 4 [facility]    Scheryl Darter, MD

## 2018-10-23 NOTE — Patient Instructions (Signed)

## 2018-11-06 ENCOUNTER — Ambulatory Visit (HOSPITAL_COMMUNITY)
Admission: RE | Admit: 2018-11-06 | Discharge: 2018-11-06 | Disposition: A | Discharge status: Home / Self Care | Payer: Medicaid Other | Source: Ambulatory Visit | Attending: Obstetrics and Gynecology | Admitting: Obstetrics and Gynecology

## 2018-11-06 DIAGNOSIS — Z3A23 23 weeks gestation of pregnancy: Secondary | ICD-10-CM | POA: Diagnosis not present

## 2018-11-06 DIAGNOSIS — Z362 Encounter for other antenatal screening follow-up: Secondary | ICD-10-CM | POA: Diagnosis not present

## 2018-11-20 ENCOUNTER — Encounter: Payer: Self-pay | Admitting: Obstetrics & Gynecology

## 2018-11-20 ENCOUNTER — Ambulatory Visit (INDEPENDENT_AMBULATORY_CARE_PROVIDER_SITE_OTHER): Payer: Medicaid Other | Admitting: Obstetrics & Gynecology

## 2018-11-20 DIAGNOSIS — Z23 Encounter for immunization: Secondary | ICD-10-CM | POA: Diagnosis not present

## 2018-11-20 DIAGNOSIS — Z3A25 25 weeks gestation of pregnancy: Secondary | ICD-10-CM

## 2018-11-20 DIAGNOSIS — Z3482 Encounter for supervision of other normal pregnancy, second trimester: Secondary | ICD-10-CM

## 2018-11-20 DIAGNOSIS — Z3480 Encounter for supervision of other normal pregnancy, unspecified trimester: Secondary | ICD-10-CM

## 2018-11-20 NOTE — Patient Instructions (Addendum)

## 2018-11-20 NOTE — Progress Notes (Signed)
Pt presents for ROB without complaints today.  

## 2018-11-20 NOTE — Progress Notes (Signed)
   PRENATAL VISIT NOTE  Subjective:  Kelli Bean is a 33 y.o. [id number] at [redacted]w[redacted]d being seen today for ongoing prenatal care.  She is currently monitored for the following issues for this low-risk pregnancy and has History of 2 spontaneous abortions; History of ectopic pregnancy; Anxiety disorder; Depression; and Supervision of other normal pregnancy, antepartum on their problem list.  Patient reports no complaints.  Contractions: Not present. Vag. Bleeding: None.  Movement: Present. Denies leaking of fluid.   The following portions of the patient's history were reviewed and updated as appropriate: allergies, current medications, past family history, past medical history, past social history, past surgical history and problem list. Problem list updated.  Objective:   Vitals:   11/20/18 1006  BP: 104/67  Pulse: 91  Weight: 166 lb 8 oz (75.5 kg)    Fetal Status: Fetal Heart Rate (bpm): 140   Movement: Present     General:  Alert, oriented and cooperative. Patient is in no acute distress.  Skin: Skin is warm and dry. No rash noted.   Cardiovascular: Normal heart rate noted  Respiratory: Normal respiratory effort, no problems with respiration noted  Abdomen: Soft, gravid, appropriate for gestational age.  Pain/Pressure: Absent     Pelvic: Cervical exam deferred        Extremities: Normal range of motion.  Edema: None  Mental Status: Normal mood and affect. Normal behavior. Normal judgment and thought content.   Assessment and Plan:  Pregnancy: [id number] at [redacted]w[redacted]d  1. Supervision of other normal pregnancy, antepartum Flu vaccine  Preterm labor symptoms and general obstetric precautions including but not limited to vaginal bleeding, contractions, leaking of fluid and fetal movement were reviewed in detail with the patient. Please refer to After Visit Summary for other counseling recommendations.  Return in about 2 weeks (around 12/04/2018) for 2 hr GTT.  No future  appointments.  Scheryl Darter, MD

## 2018-12-04 ENCOUNTER — Other Ambulatory Visit: Payer: Medicaid Other

## 2018-12-04 ENCOUNTER — Other Ambulatory Visit: Payer: Self-pay

## 2018-12-04 ENCOUNTER — Encounter: Payer: Self-pay | Admitting: Obstetrics and Gynecology

## 2018-12-04 ENCOUNTER — Ambulatory Visit (INDEPENDENT_AMBULATORY_CARE_PROVIDER_SITE_OTHER): Payer: Medicaid Other | Admitting: Obstetrics and Gynecology

## 2018-12-04 VITALS — BP 107/69 | HR 91 | Wt 165.2 lb

## 2018-12-04 DIAGNOSIS — Z3480 Encounter for supervision of other normal pregnancy, unspecified trimester: Secondary | ICD-10-CM

## 2018-12-04 DIAGNOSIS — Z23 Encounter for immunization: Secondary | ICD-10-CM

## 2018-12-04 DIAGNOSIS — Z3A27 27 weeks gestation of pregnancy: Secondary | ICD-10-CM

## 2018-12-04 DIAGNOSIS — Z3482 Encounter for supervision of other normal pregnancy, second trimester: Secondary | ICD-10-CM

## 2018-12-04 NOTE — Patient Instructions (Signed)
Third Trimester of Pregnancy The third trimester is from week 28 through week 40 (months 7 through 9). The third trimester is a time when the unborn baby (fetus) is growing rapidly. At the end of the ninth month, the fetus is about 20 inches in length and weighs 6-10 pounds. Body changes during your third trimester Your body will continue to go through many changes during pregnancy. The changes vary from woman to woman. During the third trimester:  Your weight will continue to increase. You can expect to gain 25-35 pounds (11-16 kg) by the end of the pregnancy.  You may begin to get stretch marks on your hips, abdomen, and breasts.  You may urinate more often because the fetus is moving lower into your pelvis and pressing on your bladder.  You may develop or continue to have heartburn. This is caused by increased hormones that slow down muscles in the digestive tract.  You may develop or continue to have constipation because increased hormones slow digestion and cause the muscles that push waste through your intestines to relax.  You may develop hemorrhoids. These are swollen veins (varicose veins) in the rectum that can itch or be painful.  You may develop swollen, bulging veins (varicose veins) in your legs.  You may have increased body aches in the pelvis, back, or thighs. This is due to weight gain and increased hormones that are relaxing your joints.  You may have changes in your hair. These can include thickening of your hair, rapid growth, and changes in texture. Some women also have hair loss during or after pregnancy, or hair that feels dry or thin. Your hair will most likely return to normal after your baby is born.  Your breasts will continue to grow and they will continue to become tender. A yellow fluid (colostrum) may leak from your breasts. This is the first milk you are producing for your baby.  Your belly button may stick out.  You may notice more swelling in your hands,  face, or ankles.  You may have increased tingling or numbness in your hands, arms, and legs. The skin on your belly may also feel numb.  You may feel short of breath because of your expanding uterus.  You may have more problems sleeping. This can be caused by the size of your belly, increased need to urinate, and an increase in your body's metabolism.  You may notice the fetus "dropping," or moving lower in your abdomen (lightening).  You may have increased vaginal discharge.  You may notice your joints feel loose and you may have pain around your pelvic bone. What to expect at prenatal visits You will have prenatal exams every 2 weeks until week 36. Then you will have weekly prenatal exams. During a routine prenatal visit:  You will be weighed to make sure you and the baby are growing normally.  Your blood pressure will be taken.  Your abdomen will be measured to track your baby's growth.  The fetal heartbeat will be listened to.  Any test results from the previous visit will be discussed.  You may have a cervical check near your due date to see if your cervix has softened or thinned (effaced).  You will be tested for Group B streptococcus. This happens between 35 and 37 weeks. Your health care provider may ask you:  What your birth plan is.  How you are feeling.  If you are feeling the baby move.  If you have had any abnormal   symptoms, such as leaking fluid, bleeding, severe headaches, or abdominal cramping.  If you are using any tobacco products, including cigarettes, chewing tobacco, and electronic cigarettes.  If you have any questions. Other tests or screenings that may be performed during your third trimester include:  Blood tests that check for low iron levels (anemia).  Fetal testing to check the health, activity level, and growth of the fetus. Testing is done if you have certain medical conditions or if there are problems during the pregnancy.  Nonstress test  (NST). This test checks the health of your baby to make sure there are no signs of problems, such as the baby not getting enough oxygen. During this test, a belt is placed around your belly. The baby is made to move, and its heart rate is monitored during movement. What is false labor? False labor is a condition in which you feel small, irregular tightenings of the muscles in the womb (contractions) that usually go away with rest, changing position, or drinking water. These are called Braxton Hicks contractions. Contractions may last for hours, days, or even weeks before true labor sets in. If contractions come at regular intervals, become more frequent, increase in intensity, or become painful, you should see your health care provider. What are the signs of labor?  Abdominal cramps.  Regular contractions that start at 10 minutes apart and become stronger and more frequent with time.  Contractions that start on the top of the uterus and spread down to the lower abdomen and back.  Increased pelvic pressure and dull back pain.  A watery or bloody mucus discharge that comes from the vagina.  Leaking of amniotic fluid. This is also known as your "water breaking." It could be a slow trickle or a gush. Let your health care provider know if it has a color or strange odor. If you have any of these signs, call your health care provider right away, even if it is before your due date. Follow these instructions at home: Medicines  Follow your health care provider's instructions regarding medicine use. Specific medicines may be either safe or unsafe to take during pregnancy.  Take a prenatal vitamin that contains at least 600 micrograms (mcg) of folic acid.  If you develop constipation, try taking a stool softener if your health care provider approves. Eating and drinking   Eat a balanced diet that includes fresh fruits and vegetables, whole grains, good sources of protein such as meat, eggs, or tofu,  and low-fat dairy. Your health care provider will help you determine the amount of weight gain that is right for you.  Avoid raw meat and uncooked cheese. These carry germs that can cause birth defects in the baby.  If you have low calcium intake from food, talk to your health care provider about whether you should take a daily calcium supplement.  Eat four or five small meals rather than three large meals a day.  Limit foods that are high in fat and processed sugars, such as fried and sweet foods.  To prevent constipation: ? Drink enough fluid to keep your urine clear or pale yellow. ? Eat foods that are high in fiber, such as fresh fruits and vegetables, whole grains, and beans. Activity  Exercise only as directed by your health care provider. Most women can continue their usual exercise routine during pregnancy. Try to exercise for 30 minutes at least 5 days a week. Stop exercising if you experience uterine contractions.  Avoid heavy lifting.  Do   not exercise in extreme heat or humidity, or at high altitudes.  Wear low-heel, comfortable shoes.  Practice good posture.  You may continue to have sex unless your health care provider tells you otherwise. Relieving pain and discomfort  Take frequent breaks and rest with your legs elevated if you have leg cramps or low back pain.  Take warm sitz baths to soothe any pain or discomfort caused by hemorrhoids. Use hemorrhoid cream if your health care provider approves.  Wear a good support bra to prevent discomfort from breast tenderness.  If you develop varicose veins: ? Wear support pantyhose or compression stockings as told by your healthcare provider. ? Elevate your feet for 15 minutes, 3-4 times a day. Prenatal care  Write down your questions. Take them to your prenatal visits.  Keep all your prenatal visits as told by your health care provider. This is important. Safety  Wear your seat belt at all times when driving.  Make  a list of emergency phone numbers, including numbers for family, friends, the hospital, and police and fire departments. General instructions  Avoid cat litter boxes and soil used by cats. These carry germs that can cause birth defects in the baby. If you have a cat, ask someone to clean the litter box for you.  Do not travel far distances unless it is absolutely necessary and only with the approval of your health care provider.  Do not use hot tubs, steam rooms, or saunas.  Do not drink alcohol.  Do not use any products that contain nicotine or tobacco, such as cigarettes and e-cigarettes. If you need help quitting, ask your health care provider.  Do not use any medicinal herbs or unprescribed drugs. These chemicals affect the formation and growth of the baby.  Do not douche or use tampons or scented sanitary pads.  Do not cross your legs for long periods of time.  To prepare for the arrival of your baby: ? Take prenatal classes to understand, practice, and ask questions about labor and delivery. ? Make a trial run to the hospital. ? Visit the hospital and tour the maternity area. ? Arrange for maternity or paternity leave through employers. ? Arrange for family and friends to take care of pets while you are in the hospital. ? Purchase a rear-facing car seat and make sure you know how to install it in your car. ? Pack your hospital bag. ? Prepare the baby's nursery. Make sure to remove all pillows and stuffed animals from the baby's crib to prevent suffocation.  Visit your dentist if you have not gone during your pregnancy. Use a soft toothbrush to brush your teeth and be gentle when you floss. Contact a health care provider if:  You are unsure if you are in labor or if your water has broken.  You become dizzy.  You have mild pelvic cramps, pelvic pressure, or nagging pain in your abdominal area.  You have lower back pain.  You have persistent nausea, vomiting, or  diarrhea.  You have an unusual or bad smelling vaginal discharge.  You have pain when you urinate. Get help right away if:  Your water breaks before 37 weeks.  You have regular contractions less than 5 minutes apart before 37 weeks.  You have a fever.  You are leaking fluid from your vagina.  You have spotting or bleeding from your vagina.  You have severe abdominal pain or cramping.  You have rapid weight loss or weight gain.  You have   shortness of breath with chest pain.  You notice sudden or extreme swelling of your face, hands, ankles, feet, or legs.  Your baby makes fewer than 10 movements in 2 hours.  You have severe headaches that do not go away when you take medicine.  You have vision changes. Summary  The third trimester is from week 28 through week 40, months 7 through 9. The third trimester is a time when the unborn baby (fetus) is growing rapidly.  During the third trimester, your discomfort may increase as you and your baby continue to gain weight. You may have abdominal, leg, and back pain, sleeping problems, and an increased need to urinate.  During the third trimester your breasts will keep growing and they will continue to become tender. A yellow fluid (colostrum) may leak from your breasts. This is the first milk you are producing for your baby.  False labor is a condition in which you feel small, irregular tightenings of the muscles in the womb (contractions) that eventually go away. These are called Braxton Hicks contractions. Contractions may last for hours, days, or even weeks before true labor sets in.  Signs of labor can include: abdominal cramps; regular contractions that start at 10 minutes apart and become stronger and more frequent with time; watery or bloody mucus discharge that comes from the vagina; increased pelvic pressure and dull back pain; and leaking of amniotic fluid. This information is not intended to replace advice given to you by your  health care provider. Make sure you discuss any questions you have with your health care provider. Document Released: 08/30/2001 Document Revised: 10/11/2016 Document Reviewed: 10/11/2016 Elsevier Interactive Patient Education  2019 [employer]  

## 2018-12-04 NOTE — Progress Notes (Signed)
Pt is here for ROB. [redacted]w[redacted]d. TDAP given tolerated well.

## 2018-12-04 NOTE — Progress Notes (Signed)
Subjective:  Kelli Bean is a 33 y.o. [id number] at [redacted]w[redacted]d being seen today for ongoing prenatal care.  She is currently monitored for the following issues for this low-risk pregnancy and has History of 2 spontaneous abortions; History of ectopic pregnancy; Anxiety disorder; Depression; and Supervision of other normal pregnancy, antepartum on their problem list.  Patient reports no complaints.  Contractions: Not present. Vag. Bleeding: None.  Movement: Present. Denies leaking of fluid.   The following portions of the patient's history were reviewed and updated as appropriate: allergies, current medications, past family history, past medical history, past social history, past surgical history and problem list. Problem list updated.  Objective:   Vitals:   12/04/18 0940  BP: 107/69  Pulse: 91  Weight: 165 lb 3.2 oz (74.9 kg)    Fetal Status: Fetal Heart Rate (bpm): 136   Movement: Present     General:  Alert, oriented and cooperative. Patient is in no acute distress.  Skin: Skin is warm and dry. No rash noted.   Cardiovascular: Normal heart rate noted  Respiratory: Normal respiratory effort, no problems with respiration noted  Abdomen: Soft, gravid, appropriate for gestational age. Pain/Pressure: Absent     Pelvic:  Cervical exam deferred        Extremities: Normal range of motion.  Edema: None  Mental Status: Normal mood and affect. Normal behavior. Normal judgment and thought content.   Urinalysis:      Assessment and Plan:  Pregnancy: [id number] at [redacted]w[redacted]d  1. Supervision of other normal pregnancy, antepartum Stable Glucola today - Tdap vaccine greater than or equal to 7yo IM  Preterm labor symptoms and general obstetric precautions including but not limited to vaginal bleeding, contractions, leaking of fluid and fetal movement were reviewed in detail with the patient. Please refer to After Visit Summary for other counseling recommendations.  Return in about 2 weeks (around  12/18/2018) for OB visit.   Hermina Staggers, MD

## 2018-12-05 LAB — CBC
Hematocrit: 31.9 % — ABNORMAL LOW (ref 34.0–46.6)
Hemoglobin: 10.7 g/dL — ABNORMAL LOW (ref 11.1–15.9)
MCH: 31.2 pg (ref 26.6–33.0)
MCHC: 33.5 g/dL (ref 31.5–35.7)
MCV: 93 fL (ref 79–97)
PLATELETS: 231 10*3/uL (ref 150–450)
RBC: 3.43 x10E6/uL — ABNORMAL LOW (ref 3.77–5.28)
RDW: 12.7 % (ref 11.7–15.4)
WBC: 6.6 10*3/uL (ref 3.4–10.8)

## 2018-12-05 LAB — GLUCOSE TOLERANCE, 2 HOURS W/ 1HR
GLUCOSE, 2 HOUR: 94 mg/dL (ref 65–152)
GLUCOSE, FASTING: 81 mg/dL (ref 65–91)
Glucose, 1 hour: 147 mg/dL (ref 65–179)

## 2018-12-05 LAB — HIV ANTIBODY (ROUTINE TESTING W REFLEX): HIV SCREEN 4TH GENERATION: NONREACTIVE

## 2018-12-05 LAB — RPR: RPR Ser Ql: NONREACTIVE

## 2018-12-06 ENCOUNTER — Telehealth: Payer: Self-pay

## 2018-12-06 MED ORDER — FERROUS SULFATE 325 (65 FE) MG PO TABS: 325.0000 mg | ORAL_TABLET | Freq: Every day | ORAL | 3 refills | Status: DC

## 2018-12-06 NOTE — Telephone Encounter (Signed)
S/w pt and advised of need for iron supplement. rx sent.

## 2018-12-18 ENCOUNTER — Ambulatory Visit (INDEPENDENT_AMBULATORY_CARE_PROVIDER_SITE_OTHER): Payer: Medicaid Other | Admitting: Obstetrics and Gynecology

## 2018-12-18 ENCOUNTER — Encounter: Payer: Self-pay | Admitting: Obstetrics and Gynecology

## 2018-12-18 ENCOUNTER — Other Ambulatory Visit: Payer: Self-pay

## 2018-12-18 VITALS — BP 105/69 | HR 88 | Wt 166.2 lb

## 2018-12-18 DIAGNOSIS — Z3480 Encounter for supervision of other normal pregnancy, unspecified trimester: Secondary | ICD-10-CM

## 2018-12-18 DIAGNOSIS — Z3483 Encounter for supervision of other normal pregnancy, third trimester: Secondary | ICD-10-CM

## 2018-12-18 DIAGNOSIS — Z3A29 29 weeks gestation of pregnancy: Secondary | ICD-10-CM

## 2018-12-18 NOTE — Patient Instructions (Addendum)
Use the following websites (and others) to help learn more about your contraception options and find the method that is right for you!  - The Centers for Disease Control (CDC) website: TransferLive.se  - [facility] website: https://www.[facility]renthood.org/learn/birth-control  - Beds[employer].org: https://www.bedsider.org/methods    HOW TO TAKE YOUR BLOOD PRESSURE AT HOME  Take your blood pressure at home. Pick one day a week and take your blood pressure consistently every week on this day. Relax quietly for about 15 minutes then take your blood pressure. DO NOT take it when you are upset, stressed, or have just been active. Make sure your blood pressure cuff fits appropriately, there should be measurements on the box and on the cuff to help guide you.   Once you have taken your blood pressure, look at the numbers. If the top number (systolic) is equal to or above 140, please call the office and let us know. If the bottom number (diastolic) is equal to or above 90, please call the office and let us know.   If you have any questions or are concerned you are not taking your blood pressure correctly, please call the office!  If you have a headache that does not improve, problems with your vision, abdominal pain or other concerns, please call and let us know. Please go to the hospital with any severe symptoms. Please go to the hospital for labor, painful contractions every 5 minutes or less, leaking of fluid, or if you have not felt normal fetal movement.

## 2018-12-18 NOTE — Progress Notes (Signed)
   PRENATAL VISIT NOTE  Subjective:  Kelli Bean is a 33 y.o. [id number] at [redacted]w[redacted]d being seen today for ongoing prenatal care.  She is currently monitored for the following issues for this low-risk pregnancy and has History of 2 spontaneous abortions; History of ectopic pregnancy; Anxiety disorder; Depression; and Supervision of other normal pregnancy, antepartum on their problem list.  Patient reports occasional contractions.  Contractions: Not present. Vag. Bleeding: None.  Movement: Present. Denies leaking of fluid.   The following portions of the patient's history were reviewed and updated as appropriate: allergies, current medications, past family history, past medical history, past social history, past surgical history and problem list.   Objective:   Vitals:   12/18/18 0926  BP: 105/69  Pulse: 88  Weight: 166 lb 3.2 oz (75.4 kg)   Fetal Status: Fetal Heart Rate (bpm): 141   Movement: Present     General:  Alert, oriented and cooperative. Patient is in no acute distress.  Skin: Skin is warm and dry. No rash noted.   Cardiovascular: Normal heart rate noted  Respiratory: Normal respiratory effort, no problems with respiration noted  Abdomen: Soft, gravid, appropriate for gestational age.  Pain/Pressure: Present     Pelvic: Cervical exam deferred        Extremities: Normal range of motion.  Edema: None  Mental Status: Normal mood and affect. Normal behavior. Normal judgment and thought content.   Assessment and Plan:  Pregnancy: [id number] at [redacted]w[redacted]d  1. Supervision of other normal pregnancy, antepartum Reviewed options for contraception, she will consider BTL but likely not Gave info  Preterm labor symptoms and general obstetric precautions including but not limited to vaginal bleeding, contractions, leaking of fluid and fetal movement were reviewed in detail with the patient. Please refer to After Visit Summary for other counseling recommendations.    Return in about 2  weeks (around 01/01/2019) for OB visit.  Future Appointments  Date Time Provider Department Center  01/01/2019 10:15 AM Leftwich-Kirby, Wilmer Floor, CNM [facility] None    Conan Bowens, MD

## 2018-12-18 NOTE — Progress Notes (Signed)
ROB.  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 She wants to discuss  BC Options.

## 2019-01-01 ENCOUNTER — Ambulatory Visit (INDEPENDENT_AMBULATORY_CARE_PROVIDER_SITE_OTHER): Payer: Medicaid Other | Admitting: Advanced Practice Midwife

## 2019-01-01 ENCOUNTER — Other Ambulatory Visit: Payer: Self-pay

## 2019-01-01 VITALS — BP 126/56 | HR 90

## 2019-01-01 DIAGNOSIS — G5600 Carpal tunnel syndrome, unspecified upper limb: Secondary | ICD-10-CM

## 2019-01-01 DIAGNOSIS — Z3A31 31 weeks gestation of pregnancy: Secondary | ICD-10-CM

## 2019-01-01 DIAGNOSIS — O26893 Other specified pregnancy related conditions, third trimester: Secondary | ICD-10-CM | POA: Diagnosis not present

## 2019-01-01 DIAGNOSIS — O26899 Other specified pregnancy related conditions, unspecified trimester: Principal | ICD-10-CM

## 2019-01-01 DIAGNOSIS — Z3480 Encounter for supervision of other normal pregnancy, unspecified trimester: Secondary | ICD-10-CM

## 2019-01-01 MED ORDER — WRIST BRACE/LEFT SMALL MISC: 1.0000 | Freq: Every day | 0 refills | Status: DC

## 2019-01-01 MED ORDER — WRIST BRACE/RIGHT SMALL MISC: 1.0000 | Freq: Every day | 0 refills | Status: DC

## 2019-01-01 NOTE — Progress Notes (Signed)
   TELEHEALTH VIRTUAL OBSTETRICS VISIT ENCOUNTER NOTE  I connected with Kelli Bean on 01/01/19 at 10:15 AM EDT by telephone at home and verified that I am speaking with the correct person using two identifiers.   I discussed the limitations, risks, security and privacy concerns of performing an evaluation and management service by telephone and the availability of in person appointments. I also discussed with the patient that there may be a patient responsible charge related to this service. The patient expressed understanding and agreed to proceed.  Subjective:  Kelli Bean is a 33 y.o. [id number] at [redacted]w[redacted]d being followed for ongoing prenatal care.  She is currently monitored for the following issues for this low-risk pregnancy and has History of 2 spontaneous abortions; History of ectopic pregnancy; Anxiety disorder; Depression; and Supervision of other normal pregnancy, antepartum on their problem list.  Patient reports pain in both wrists with movement. Reports fetal movement. Denies any contractions, bleeding or leaking of fluid.   The following portions of the patient's history were reviewed and updated as appropriate: allergies, current medications, past family history, past medical history, past social history, past surgical history and problem list.   Objective:   General:  Alert, oriented and cooperative.   Mental Status: Normal mood and affect perceived. Normal judgment and thought content.  Rest of physical exam deferred due to type of encounter  Assessment and Plan:  Pregnancy: [id number] at [redacted]w[redacted]d  1. Supervision of other normal pregnancy, antepartum --Anticipatory guidance about next visits/weeks of pregnancy given. --Reviewed safety, visitor policy, reassurance about COVID-19 for pregnancy at this time. Discussed possible changes to visits, including televisits, that may occur due to COVID-19.  The office remains open if pt needs to be seen and MAU is open 24  hours/day for OB emergencies.   2. Carpal tunnel syndrome during pregnancy --Rest/ice/Tylenol/wrist braces - Elastic Bandages & Supports (WRIST BRACE/LEFT SMALL) MISC; 1 Device by Does not apply route daily.  Dispense: 1 each; Refill: 0 - Elastic Bandages & Supports (WRIST BRACE/RIGHT SMALL) MISC; 1 Device by Does not apply route daily.  Dispense: 1 each; Refill: 0  Preterm labor symptoms and general obstetric precautions including but not limited to vaginal bleeding, contractions, leaking of fluid and fetal movement were reviewed in detail with the patient.  I discussed the assessment and treatment plan with the patient. The patient was provided an opportunity to ask questions and all were answered. The patient agreed with the plan and demonstrated an understanding of the instructions. The patient was advised to call back or seek an in-person office evaluation/go to [facility]U at [facility] for any urgent or concerning symptoms. Please refer to After Visit Summary for other counseling recommendations.   I provided 12 minutes of non-face-to-face time during this encounter.  Return in about 2 weeks (around 01/15/2019).  Future Appointments  Date Time Provider Department Center  01/15/2019  9:55 AM Leftwich-Kirby, Wilmer Floor, CNM [facility] None    Sharen Counter, CNM [facility] [employer], [facility]

## 2019-01-01 NOTE — Progress Notes (Signed)
S/w pt via tele visit, pt reports fetal movement with occasional pressure. Pt reports issues with carpal tunnel. Pt took BP at home 126/56, pulse 90, advised pt to activate babyrx.

## 2019-01-15 ENCOUNTER — Ambulatory Visit (INDEPENDENT_AMBULATORY_CARE_PROVIDER_SITE_OTHER): Payer: Medicaid Other | Admitting: Obstetrics

## 2019-01-15 ENCOUNTER — Encounter: Payer: Self-pay | Admitting: Obstetrics

## 2019-01-15 ENCOUNTER — Other Ambulatory Visit: Payer: Self-pay

## 2019-01-15 DIAGNOSIS — O26899 Other specified pregnancy related conditions, unspecified trimester: Secondary | ICD-10-CM

## 2019-01-15 DIAGNOSIS — R12 Heartburn: Secondary | ICD-10-CM | POA: Diagnosis not present

## 2019-01-15 DIAGNOSIS — Z3A33 33 weeks gestation of pregnancy: Secondary | ICD-10-CM

## 2019-01-15 DIAGNOSIS — O26893 Other specified pregnancy related conditions, third trimester: Secondary | ICD-10-CM | POA: Diagnosis not present

## 2019-01-15 DIAGNOSIS — Z3480 Encounter for supervision of other normal pregnancy, unspecified trimester: Secondary | ICD-10-CM

## 2019-01-15 MED ORDER — OMEPRAZOLE 20 MG PO CPDR: 20.0000 mg | DELAYED_RELEASE_CAPSULE | Freq: Two times a day (BID) | ORAL | 5 refills | Status: DC

## 2019-01-15 NOTE — Progress Notes (Signed)
   PRENATAL VISIT NOTE TELEHEALTH VIRTUAL OBSTETRICS VISIT ENCOUNTER NOTE  I connected with@ on 01/15/19 at  9:55 AM EDT by Webex at home and verified that I am speaking with the correct person using two identifiers.   I discussed the limitations, risks, security and privacy concerns of performing an evaluation and management service by telephone and the availability of in person appointments. I also discussed with the patient that there may be a patient responsible charge related to this service. The patient expressed understanding and agreed to proceed. Subjective:  Kelli Bean is a 33 y.o. E2C0034 at [redacted]w[redacted]d being seen today for ongoing prenatal care.  She is currently monitored for the following issues for this low-risk pregnancy and has History of 2 spontaneous abortions; History of ectopic pregnancy; Anxiety disorder; Depression; and Supervision of other normal pregnancy, antepartum on their problem list.  Patient reports heartburn.  Reports fetal movement. Contractions: Not present. Vag. Bleeding: None.  Movement: Present. Denies any contractions, bleeding or leaking of fluid.   The following portions of the patient's history were reviewed and updated as appropriate: allergies, current medications, past family history, past medical history, past social history, past surgical history and problem list.   Objective:  There were no vitals filed for this visit.  Fetal Status:     Movement: Present     General:  Alert, oriented and cooperative. Patient is in no acute distress.  Respiratory: Normal respiratory effort, no problems with respiration noted  Mental Status: Normal mood and affect. Normal behavior. Normal judgment and thought content.  Rest of physical exam deferred due to type of encounter  Assessment and Plan:  Pregnancy: [id number] at [redacted]w[redacted]d  1. Supervision of other normal pregnancy, antepartum - doing well  2. Heartburn during pregnancy, antepartum Rx: - omeprazole  (PRILOSEC) 20 MG capsule; Take 1 capsule (20 mg total) by mouth 2 (two) times daily before a meal.  Dispense: 60 capsule; Refill: 5   Preterm labor symptoms and general obstetric precautions including but not limited to vaginal bleeding, contractions, leaking of fluid and fetal movement were reviewed in detail with the patient. I discussed the assessment and treatment plan with the patient. The patient was provided an opportunity to ask questions and all were answered. The patient agreed with the plan and demonstrated an understanding of the instructions. The patient was advised to call back or seek an in-person office evaluation/go to MAU at [facility] for any urgent or concerning symptoms. Please refer to After Visit Summary for other counseling recommendations.  I provided 10 minutes of non-face-to-face time during this encounter. Return in about 2 weeks (around 01/29/2019) for [facility].    Coral Ceo, MD Center for [facility], [facility] 01-15-2019

## 2019-01-15 NOTE — Progress Notes (Signed)
Pt is on the phone preparing for Webex visit with provider. [redacted]w[redacted]d. Pt has BP cuff but not accessible at the time of call. Pt instructed to check BP today and let us know if it is elevated.

## 2019-01-29 ENCOUNTER — Encounter: Payer: Self-pay | Admitting: Obstetrics

## 2019-01-29 ENCOUNTER — Other Ambulatory Visit: Payer: Self-pay

## 2019-01-29 ENCOUNTER — Ambulatory Visit (INDEPENDENT_AMBULATORY_CARE_PROVIDER_SITE_OTHER): Payer: Medicaid Other | Admitting: Obstetrics

## 2019-01-29 VITALS — BP 115/61 | HR 114

## 2019-01-29 DIAGNOSIS — Z3480 Encounter for supervision of other normal pregnancy, unspecified trimester: Secondary | ICD-10-CM

## 2019-01-29 DIAGNOSIS — Z3483 Encounter for supervision of other normal pregnancy, third trimester: Secondary | ICD-10-CM | POA: Diagnosis not present

## 2019-01-29 DIAGNOSIS — Z3A35 35 weeks gestation of pregnancy: Secondary | ICD-10-CM | POA: Diagnosis not present

## 2019-01-29 NOTE — Progress Notes (Signed)
   TELEHEALTH VIRTUAL OBSTETRICS PRENATAL VISIT ENCOUNTER NOTE  I connected with Kelli Bean on 01/29/19 at 10:00 AM EDT by WebEx at home and verified that I am speaking with the correct person using two identifiers.   I discussed the limitations, risks, security and privacy concerns of performing an evaluation and management service by telephone and the availability of in person appointments. I also discussed with the patient that there may be a patient responsible charge related to this service. The patient expressed understanding and agreed to proceed. Subjective:  Kelli Bean is a 33 y.o. [id number] at [redacted]w[redacted]d being seen today for ongoing prenatal care.  She is currently monitored for the following issues for this low-risk pregnancy and has History of 2 spontaneous abortions; History of ectopic pregnancy; Anxiety disorder; Depression; and Supervision of other normal pregnancy, antepartum on their problem list.  Patient reports no complaints.  Reports fetal movement. Contractions: Irritability. Vag. Bleeding: None.  Movement: Present. Denies any contractions, bleeding or leaking of fluid.   The following portions of the patient's history were reviewed and updated as appropriate: allergies, current medications, past family history, past medical history, past social history, past surgical history and problem list.   Objective:   Vitals:   01/29/19 0959  BP: 115/61  Pulse: (!) 114    Fetal Status:     Movement: Present     General:  Alert, oriented and cooperative. Patient is in no acute distress.  Respiratory: Normal respiratory effort, no problems with respiration noted  Mental Status: Normal mood and affect. Normal behavior. Normal judgment and thought content.  Rest of physical exam deferred due to type of encounter  Assessment and Plan:  Pregnancy: [id number] at [redacted]w[redacted]d 1. Supervision of other normal pregnancy, antepartum   Preterm labor symptoms and general obstetric  precautions including but not limited to vaginal bleeding, contractions, leaking of fluid and fetal movement were reviewed in detail with the patient. I discussed the assessment and treatment plan with the patient. The patient was provided an opportunity to ask questions and all were answered. The patient agreed with the plan and demonstrated an understanding of the instructions. The patient was advised to call back or seek an in-person office evaluation/go to [facility]U at [facility] for any urgent or concerning symptoms. Please refer to After Visit Summary for other counseling recommendations.   I provided 10 minutes of face-to-face via WebEx time during this encounter.  Return in about 1 week (around 02/05/2019) for ROB.  No future appointments.  Coral Ceo, MD Center for [facility], [facility] 01-29-2019

## 2019-01-29 NOTE — Progress Notes (Signed)
Pt presents for webex visit. Pt identified with two pt identifiers. She is [redacted]w[redacted]d. BP this am is 115/61. Pt has no concerns today.

## 2019-02-06 ENCOUNTER — Encounter: Payer: Self-pay | Admitting: Obstetrics and Gynecology

## 2019-02-06 ENCOUNTER — Ambulatory Visit (INDEPENDENT_AMBULATORY_CARE_PROVIDER_SITE_OTHER): Payer: Medicaid Other | Admitting: Obstetrics and Gynecology

## 2019-02-06 ENCOUNTER — Other Ambulatory Visit: Payer: Self-pay

## 2019-02-06 ENCOUNTER — Other Ambulatory Visit (HOSPITAL_COMMUNITY)
Admission: RE | Admit: 2019-02-06 | Discharge: 2019-02-06 | Disposition: A | Discharge status: Home / Self Care | Payer: Medicaid Other | Source: Ambulatory Visit | Attending: Obstetrics and Gynecology | Admitting: Obstetrics and Gynecology

## 2019-02-06 VITALS — BP 103/70 | HR 89 | Temp 98.4°F | Wt 170.0 lb

## 2019-02-06 DIAGNOSIS — Z3A37 37 weeks gestation of pregnancy: Secondary | ICD-10-CM

## 2019-02-06 DIAGNOSIS — Z3480 Encounter for supervision of other normal pregnancy, unspecified trimester: Secondary | ICD-10-CM

## 2019-02-06 DIAGNOSIS — Z3483 Encounter for supervision of other normal pregnancy, third trimester: Secondary | ICD-10-CM

## 2019-02-06 NOTE — Progress Notes (Signed)
ROB with complaints of Carpel Tunnel

## 2019-02-06 NOTE — Progress Notes (Signed)
Subjective:  Kelli Bean is a 33 y.o. X4H0388 at [redacted]w[redacted]d being seen today for ongoing prenatal care.  She is currently monitored for the following issues for this low-risk pregnancy and has History of 2 spontaneous abortions; History of ectopic pregnancy; Anxiety disorder; Depression; and Supervision of other normal pregnancy, antepartum on their problem list.  Patient reports general discomforts of pregnancy.  Contractions: Irritability. Vag. Bleeding: None.  Movement: Present. Denies leaking of fluid.   The following portions of the patient's history were reviewed and updated as appropriate: allergies, current medications, past family history, past medical history, past social history, past surgical history and problem list. Problem list updated.  Objective:   Vitals:   02/06/19 1537  BP: 103/70  Pulse: 89  Temp: 98.4 F (36.9 C)  Weight: 170 lb (77.1 kg)    Fetal Status: Fetal Heart Rate (bpm): 140   Movement: Present     General:  Alert, oriented and cooperative. Patient is in no acute distress.  Skin: Skin is warm and dry. No rash noted.   Cardiovascular: Normal heart rate noted  Respiratory: Normal respiratory effort, no problems with respiration noted  Abdomen: Soft, gravid, appropriate for gestational age. Pain/Pressure: Absent     Pelvic:  Cervical exam performed        Extremities: Normal range of motion.  Edema: None  Mental Status: Normal mood and affect. Normal behavior. Normal judgment and thought content.   Urinalysis:      Assessment and Plan:  Pregnancy: E2C0034 at [redacted]w[redacted]d  1. Supervision of other normal pregnancy, antepartum Labor precautions - Strep Gp B NAA - GC/Chlamydia probe amp (Panacea)not at [facility]  Term labor symptoms and general obstetric precautions including but not limited to vaginal bleeding, contractions, leaking of fluid and fetal movement were reviewed in detail with the patient. Please refer to After Visit Summary for other counseling  recommendations.  Return in about 2 weeks (around 02/20/2019) for OB visit, televisit.   Hermina Staggers, MD

## 2019-02-06 NOTE — Patient Instructions (Signed)

## 2019-02-07 LAB — GC/CHLAMYDIA PROBE AMP (~~LOC~~) NOT AT ARMC
Chlamydia: NEGATIVE
Neisseria Gonorrhea: NEGATIVE

## 2019-02-08 LAB — STREP GP B NAA: Strep Gp B NAA: NEGATIVE

## 2019-02-20 ENCOUNTER — Telehealth: Payer: Medicaid Other | Admitting: Obstetrics & Gynecology

## 2019-02-20 ENCOUNTER — Other Ambulatory Visit: Payer: Self-pay

## 2019-02-20 DIAGNOSIS — Z3480 Encounter for supervision of other normal pregnancy, unspecified trimester: Secondary | ICD-10-CM

## 2019-02-20 NOTE — Progress Notes (Unsigned)
   TELEHEALTH VIRTUAL OBSTETRICS VISIT ENCOUNTER NOTE  I connected with Kelli Bean on 02/20/19 at 10:30 AM EDT by telephone at home and verified that I am speaking with the correct person using two identifiers.   I discussed the limitations, risks, security and privacy concerns of performing an evaluation and management service by telephone and the availability of in person appointments. I also discussed with the patient that there may be a patient responsible charge related to this service. The patient expressed understanding and agreed to proceed.  Subjective:  Kelli Bean is a 33 y.o. D3U2025 at [redacted]w[redacted]d being followed for ongoing prenatal care.  She is currently monitored for the following issues for this low-risk pregnancy and has History of 2 spontaneous abortions; History of ectopic pregnancy; Anxiety disorder; Depression; and Supervision of other normal pregnancy, antepartum on their problem list.  Patient reports occasional contractions. Reports fetal movement. Denies any contractions, bleeding or leaking of fluid.   The following portions of the patient's history were reviewed and updated as appropriate: allergies, current medications, past family history, past medical history, past social history, past surgical history and problem list.   Objective:   General:  Alert, oriented and cooperative.   Mental Status: Normal mood and affect perceived. Normal judgment and thought content.  Rest of physical exam deferred due to type of encounter  Assessment and Plan:  Pregnancy: K2H0623 at [redacted]w[redacted]d 1. Supervision of other normal pregnancy, antepartum BP normal, will have in person visit next  Term labor symptoms and general obstetric precautions including but not limited to vaginal bleeding, contractions, leaking of fluid and fetal movement were reviewed in detail with the patient.  I discussed the assessment and treatment plan with the patient. The patient was provided an  opportunity to ask questions and all were answered. The patient agreed with the plan and demonstrated an understanding of the instructions. The patient was advised to call back or seek an in-person office evaluation/go to MAU at [facility] for any urgent or concerning symptoms. Please refer to After Visit Summary for other counseling recommendations.   I provided 10 minutes of non-face-to-face time during this encounter.  Return in about 1 week (around 02/27/2019) for in person.  No future appointments.  Scheryl Darter, MD Center for [facility], [facility]

## 2019-02-20 NOTE — Progress Notes (Unsigned)
S/w pt to prepare for my chart visit. Patient reports fetal movement with irregular contractions.

## 2019-02-20 NOTE — Patient Instructions (Signed)

## 2019-02-21 ENCOUNTER — Telehealth (HOSPITAL_COMMUNITY): Payer: Self-pay | Admitting: *Deleted

## 2019-02-21 NOTE — Telephone Encounter (Signed)
Preadmission screen  

## 2019-02-22 ENCOUNTER — Encounter (HOSPITAL_COMMUNITY): Payer: Self-pay | Admitting: *Deleted

## 2019-02-22 ENCOUNTER — Inpatient Hospital Stay (HOSPITAL_COMMUNITY): Payer: Medicaid Other | Admitting: Anesthesiology

## 2019-02-22 ENCOUNTER — Inpatient Hospital Stay (HOSPITAL_COMMUNITY)
Admission: AD | Admit: 2019-02-22 | Discharge: 2019-02-24 | DRG: 807 | Disposition: A | Discharge status: Home / Self Care | Payer: Medicaid Other | Attending: Obstetrics and Gynecology | Admitting: Obstetrics and Gynecology

## 2019-02-22 ENCOUNTER — Other Ambulatory Visit: Payer: Self-pay

## 2019-02-22 DIAGNOSIS — Z1159 Encounter for screening for other viral diseases: Secondary | ICD-10-CM | POA: Diagnosis not present

## 2019-02-22 DIAGNOSIS — Z8759 Personal history of other complications of pregnancy, childbirth and the puerperium: Secondary | ICD-10-CM

## 2019-02-22 DIAGNOSIS — O26893 Other specified pregnancy related conditions, third trimester: Secondary | ICD-10-CM | POA: Diagnosis present

## 2019-02-22 DIAGNOSIS — Z3A39 39 weeks gestation of pregnancy: Secondary | ICD-10-CM

## 2019-02-22 DIAGNOSIS — Z87891 Personal history of nicotine dependence: Secondary | ICD-10-CM | POA: Diagnosis not present

## 2019-02-22 DIAGNOSIS — Z789 Other specified health status: Secondary | ICD-10-CM | POA: Diagnosis present

## 2019-02-22 LAB — CBC
HCT: 32.5 % — ABNORMAL LOW (ref 36.0–46.0)
Hemoglobin: 10.8 g/dL — ABNORMAL LOW (ref 12.0–15.0)
MCH: 30.8 pg (ref 26.0–34.0)
MCHC: 33.2 g/dL (ref 30.0–36.0)
MCV: 92.6 fL (ref 80.0–100.0)
Platelets: 219 10*3/uL (ref 150–400)
RBC: 3.51 MIL/uL — ABNORMAL LOW (ref 3.87–5.11)
RDW: 13.7 % (ref 11.5–15.5)
WBC: 10.7 10*3/uL — ABNORMAL HIGH (ref 4.0–10.5)
nRBC: 0 % (ref 0.0–0.2)

## 2019-02-22 LAB — SARS CORONAVIRUS 2 BY RT PCR (HOSPITAL ORDER, PERFORMED IN ~~LOC~~ HOSPITAL LAB): SARS Coronavirus 2: NEGATIVE

## 2019-02-22 MED ORDER — DIBUCAINE (PERIANAL) 1 % EX OINT: 1.0000 "application " | TOPICAL_OINTMENT | CUTANEOUS | Status: DC | PRN

## 2019-02-22 MED ORDER — SIMETHICONE 80 MG PO CHEW: 80.0000 mg | CHEWABLE_TABLET | ORAL | Status: DC | PRN

## 2019-02-22 MED ORDER — LIDOCAINE HCL (PF) 1 % IJ SOLN: 30.0000 mL | INTRAMUSCULAR | Status: DC | PRN

## 2019-02-22 MED ORDER — ONDANSETRON HCL 4 MG/2ML IJ SOLN: 4.0000 mg | INTRAMUSCULAR | Status: DC | PRN

## 2019-02-22 MED ORDER — OXYCODONE HCL 5 MG PO TABS
5.0000 mg | ORAL_TABLET | ORAL | Status: DC | PRN
  Administered 2019-02-23 (×5): 5 mg via ORAL
  Filled 2019-02-22 (×6): qty 1

## 2019-02-22 MED ORDER — IBUPROFEN 600 MG PO TABS
600.0000 mg | ORAL_TABLET | Freq: Four times a day (QID) | ORAL | Status: DC
  Administered 2019-02-22 – 2019-02-24 (×8): 600 mg via ORAL
  Filled 2019-02-22 (×8): qty 1

## 2019-02-22 MED ORDER — EPHEDRINE 5 MG/ML INJ: 10.0000 mg | INTRAVENOUS | Status: DC | PRN

## 2019-02-22 MED ORDER — SODIUM CHLORIDE (PF) 0.9 % IJ SOLN
INTRAMUSCULAR | Status: DC | PRN
  Administered 2019-02-22: 12 mL/h via EPIDURAL

## 2019-02-22 MED ORDER — ONDANSETRON HCL 4 MG/2ML IJ SOLN
4.0000 mg | Freq: Four times a day (QID) | INTRAMUSCULAR | Status: DC | PRN
  Administered 2019-02-22: 4 mg via INTRAVENOUS
  Filled 2019-02-22: qty 2

## 2019-02-22 MED ORDER — ONDANSETRON HCL 4 MG PO TABS: 4.0000 mg | ORAL_TABLET | ORAL | Status: DC | PRN

## 2019-02-22 MED ORDER — FENTANYL-BUPIVACAINE-NACL 0.5-0.125-0.9 MG/250ML-% EP SOLN
12.0000 mL/h | EPIDURAL | Status: DC | PRN
  Filled 2019-02-22: qty 250

## 2019-02-22 MED ORDER — ACETAMINOPHEN 325 MG PO TABS: 650.0000 mg | ORAL_TABLET | ORAL | Status: DC | PRN

## 2019-02-22 MED ORDER — OXYCODONE-ACETAMINOPHEN 5-325 MG PO TABS
1.0000 | ORAL_TABLET | ORAL | Status: DC | PRN
  Administered 2019-02-22: 1 via ORAL
  Filled 2019-02-22: qty 1

## 2019-02-22 MED ORDER — LIDOCAINE HCL (PF) 1 % IJ SOLN
INTRAMUSCULAR | Status: DC | PRN
  Administered 2019-02-22: 5 mL via EPIDURAL
  Administered 2019-02-22: 5 mL

## 2019-02-22 MED ORDER — DIPHENHYDRAMINE HCL 25 MG PO CAPS: 25.0000 mg | ORAL_CAPSULE | Freq: Four times a day (QID) | ORAL | Status: DC | PRN

## 2019-02-22 MED ORDER — LACTATED RINGERS IV SOLN: 500.0000 mL | INTRAVENOUS | Status: DC | PRN

## 2019-02-22 MED ORDER — WITCH HAZEL-GLYCERIN EX PADS: 1.0000 "application " | MEDICATED_PAD | CUTANEOUS | Status: DC | PRN

## 2019-02-22 MED ORDER — FENTANYL-BUPIVACAINE-NACL 0.5-0.125-0.9 MG/250ML-% EP SOLN: 12.0000 mL/h | EPIDURAL | Status: DC | PRN

## 2019-02-22 MED ORDER — DIPHENHYDRAMINE HCL 50 MG/ML IJ SOLN: 12.5000 mg | INTRAMUSCULAR | Status: DC | PRN

## 2019-02-22 MED ORDER — LACTATED RINGERS IV SOLN: 500.0000 mL | Freq: Once | INTRAVENOUS | Status: DC

## 2019-02-22 MED ORDER — FENTANYL CITRATE (PF) 100 MCG/2ML IJ SOLN
INTRAMUSCULAR | Status: AC
  Filled 2019-02-22: qty 2

## 2019-02-22 MED ORDER — COCONUT OIL OIL: 1.0000 "application " | TOPICAL_OIL | Status: DC | PRN

## 2019-02-22 MED ORDER — SENNOSIDES-DOCUSATE SODIUM 8.6-50 MG PO TABS
2.0000 | ORAL_TABLET | ORAL | Status: DC
  Administered 2019-02-22 – 2019-02-24 (×2): 2 via ORAL
  Filled 2019-02-22 (×2): qty 2

## 2019-02-22 MED ORDER — PRENATAL MULTIVITAMIN CH
1.0000 | ORAL_TABLET | Freq: Every day | ORAL | Status: DC
  Administered 2019-02-23 – 2019-02-24 (×2): 1 via ORAL
  Filled 2019-02-22 (×2): qty 1

## 2019-02-22 MED ORDER — PHENYLEPHRINE 40 MCG/ML (10ML) SYRINGE FOR IV PUSH (FOR BLOOD PRESSURE SUPPORT): 80.0000 ug | PREFILLED_SYRINGE | INTRAVENOUS | Status: DC | PRN

## 2019-02-22 MED ORDER — ACETAMINOPHEN 325 MG PO TABS
650.0000 mg | ORAL_TABLET | ORAL | Status: DC | PRN
  Administered 2019-02-23 (×5): 650 mg via ORAL
  Filled 2019-02-22 (×5): qty 2

## 2019-02-22 MED ORDER — OXYCODONE-ACETAMINOPHEN 5-325 MG PO TABS: 2.0000 | ORAL_TABLET | ORAL | Status: DC | PRN

## 2019-02-22 MED ORDER — FENTANYL CITRATE (PF) 100 MCG/2ML IJ SOLN
100.0000 ug | INTRAMUSCULAR | Status: DC | PRN
  Administered 2019-02-22: 100 ug via INTRAVENOUS

## 2019-02-22 MED ORDER — OXYTOCIN BOLUS FROM INFUSION
500.0000 mL | Freq: Once | INTRAVENOUS | Status: AC
  Administered 2019-02-22: 500 mL/h via INTRAVENOUS

## 2019-02-22 MED ORDER — LACTATED RINGERS IV SOLN: INTRAVENOUS | Status: DC

## 2019-02-22 MED ORDER — ZOLPIDEM TARTRATE 5 MG PO TABS: 5.0000 mg | ORAL_TABLET | Freq: Every evening | ORAL | Status: DC | PRN

## 2019-02-22 MED ORDER — MEASLES, MUMPS & RUBELLA VAC IJ SOLR
0.5000 mL | Freq: Once | INTRAMUSCULAR | Status: AC
  Administered 2019-02-24: 0.5 mL via SUBCUTANEOUS
  Filled 2019-02-22: qty 0.5

## 2019-02-22 MED ORDER — TETANUS-DIPHTH-ACELL PERTUSSIS 5-2.5-18.5 LF-MCG/0.5 IM SUSP: 0.5000 mL | Freq: Once | INTRAMUSCULAR | Status: DC

## 2019-02-22 MED ORDER — OXYTOCIN 40 UNITS IN NORMAL SALINE INFUSION - SIMPLE MED
2.5000 [IU]/h | INTRAVENOUS | Status: DC
  Filled 2019-02-22: qty 1000

## 2019-02-22 MED ORDER — BENZOCAINE-MENTHOL 20-0.5 % EX AERO
1.0000 "application " | INHALATION_SPRAY | CUTANEOUS | Status: DC | PRN
  Administered 2019-02-22: 1 via TOPICAL
  Filled 2019-02-22: qty 56

## 2019-02-22 MED ORDER — SOD CITRATE-CITRIC ACID 500-334 MG/5ML PO SOLN: 30.0000 mL | ORAL | Status: DC | PRN

## 2019-02-22 NOTE — H&P (Addendum)
OBSTETRIC ADMISSION HISTORY AND PHYSICAL  Kelli Bean is a 33 y.o. female [id number] with IUP at 62w2dby LMP = 19.5wku/s presenting for regular, painful contractions.   Reports fetal movement. Denies vaginal bleeding and ROM.  [facility]  Support person in labor: Patient's mother, on her way   Ultrasounds . 19w6 317g, 49%ile - poor anotomical assessment 2/2 fetal position  . 23w6 626g, 50%ile   Prenatal History/Complications: None   Past Medical History: Past Medical History:  Diagnosis Date  . Anxiety   . Depression    2008 on medication   Past Surgical History: Past Surgical History:  Procedure Laterality Date  . NO PAST SURGERIES     Obstetrical History: OB History    Gravida  7   Para  2   Term  2   Preterm      AB  4   Living  2     SAB  3   TAB      Ectopic  1   Multiple  0   Live Births  2          Social History: Social History   Socioeconomic History  . Marital status: Single    Spouse name: Not on file  . Number of children: Not on file  . Years of education: Not on file  . Highest education level: Not on file  Occupational History  . Not on file  Social Needs  . Financial resource strain: Not on file  . Food insecurity:    Worry: Not on file    Inability: Not on file  . Transportation needs:    Medical: Not on file    Non-medical: Not on file  Tobacco Use  . Smoking status: Former SResearch scientist (life sciences) . Smokeless tobacco: Never Used  Substance and Sexual Activity  . Alcohol use: Not Currently    Comment: socially  . Drug use: No  . Sexual activity: Never    Birth control/protection: None  Lifestyle  . Physical activity:    Days per week: Not on file    Minutes per session: Not on file  . Stress: Not on file  Relationships  . Social connections:    Talks on phone: Not on file    Gets together: Not on file    Attends religious service: Not on file    Active member of club or organization:  Not on file    Attends meetings of clubs or organizations: Not on file    Relationship status: Not on file  Other Topics Concern  . Not on file  Social History Narrative  . Not on file   Family History: Family History  Problem Relation Age of Onset  . Hypertension Mother   . Hypertension Father   . Cancer Maternal Grandmother   . Diabetes Maternal Grandmother   . Cancer Paternal Grandmother   . Diabetes Paternal Grandmother    Allergies: No Known Allergies  Medications Prior to Admission  Medication Sig Dispense Refill Last Dose  . Elastic Bandages & Supports (WRIST BRACE/LEFT SMALL) MISC 1 Device by Does not apply route daily. (Patient not taking: Reported on 02/20/2019) 1 each 0 Not Taking  . Elastic Bandages & Supports (WRIST BRACE/RIGHT SMALL) MISC 1 Device by Does not apply route daily. (Patient not taking: Reported on 02/20/2019) 1 each 0 Not Taking  . ferrous sulfate 325 (65 FE) MG tablet Take 1 tablet (325 mg total) by mouth daily with  breakfast. 30 tablet 3 Taking  . Multiple Vitamin (MULTIVITAMIN) capsule Take 1 capsule by mouth daily.   Taking  . omeprazole (PRILOSEC) 20 MG capsule Take 1 capsule (20 mg total) by mouth 2 (two) times daily before a meal. (Patient not taking: Reported on 02/20/2019) 60 capsule 5 Not Taking   Review of Systems  All systems reviewed and negative except as stated in HPI  Blood pressure 119/74, pulse 90, temperature 98.5 F (36.9 C), temperature source Oral, resp. rate 16, height '5\' 3"'  (1.6 m), weight 77.1 kg, last menstrual period 05/23/2018. General appearance: cooperative, appears stated age and moderate distress Lungs: no respiratory distress Heart: regular rate  Abdomen: soft, non-tender; gravid  Pelvic: 10/0/100% Extremities: Moving spontaneously, warm, well perfused. No BLEE. 2+ DP. Presentation: cephalic  Fetal monitoring: baseline 125 / mod variability/ -a / early d Uterine activity: regular contractions q2 minutes.  Dilation:  7 Effacement (%): 100 Station: Plus 1 Exam by:: Orlene Och RNC  Prenatal labs: ABO, Rh: B/Positive/-- (01/07 0936) Antibody: Negative (01/07 0936) Rubella: <0.90 (01/07 0936) RPR: Non Reactive (03/17 0932)  HBsAg: Negative (01/07 0936)  HIV: Non Reactive (03/17 0932)  GBS: Negative (05/20 0410)  Glucola: third trimester, 147-81-94 Genetic screening:  NIPS low risk   Prenatal Transfer Tool  Maternal Diabetes: No Genetic Screening: Normal Maternal Ultrasounds/Referrals: Normal Fetal Ultrasounds or other Referrals:  None Maternal Substance Abuse:  No Significant Maternal Medications:  None Significant Maternal Lab Results: Lab values include: Group B Strep negative  Results for orders placed or performed during the hospital encounter of 02/22/19 (from the past 24 hour(s))  CBC   Collection Time: 02/22/19  4:10 PM  Result Value Ref Range   WBC 10.7 (H) 4.0 - 10.5 K/uL   RBC 3.51 (L) 3.87 - 5.11 MIL/uL   Hemoglobin 10.8 (L) 12.0 - 15.0 g/dL   HCT 32.5 (L) 36.0 - 46.0 %   MCV 92.6 80.0 - 100.0 fL   MCH 30.8 26.0 - 34.0 pg   MCHC 33.2 30.0 - 36.0 g/dL   RDW 13.7 11.5 - 15.5 %   Platelets 219 150 - 400 K/uL   nRBC 0.0 0.0 - 0.2 %    Patient Active Problem List   Diagnosis Date Noted  . Labor and delivery indication for care or intervention 02/22/2019  . Supervision of other normal pregnancy, antepartum 09/25/2018  . Anxiety disorder 05/15/2016  . Depression 05/15/2016  . History of ectopic pregnancy 05/23/2014  . History of 2 spontaneous abortions 08/09/2013   Assessment/Plan:  [name] is a 33 y.o. Y7W2956 at 73w2dhere for SOL  Labor: Admitted in active labor. Contractions q258mutes. -- pain control: IV fentanyl, just had epidural placed  -- AROM w/ light mec  Fetal Wellbeing: Cephalic by CE. Baseline 125, mod variability, - a, early d -- Negative (05/20 0410)  -- Category I tracing, continuous fetal monitoring  Postpartum Planning -- breast /  bother (contraception) -- Circ -- RI/'[x]' Tdap  -- [place], hx of anxiety and depression   RaZettie CooleyM.D.  Family Medicine  PGY-1 02/22/2019 4:35 PM  CNM attestation:  I have seen and examined this patient; I agree with above documentation in the resident's note.   ElWREATHA STURGEONs a [age].o. [id number] for SOL  PE: BP (!) 117/97 Comment: pt attempting to breasfeed  Pulse (!) 101   Temp 98.5 F (36.9 C) (Oral)   Resp 16   Ht '5\' 3"'  (1.6 m)   Wt  77.1 kg   LMP 05/23/2018   BMI 30.11 kg/m   Resp: normal effort, no distress Abd: gravid  ROS, labs, PMH reviewed  Plan: Admit to Labor and Delivery Expectant management Anticipate SVD Rec MMR pp  Myrtis Ser [facility] 02/22/2019 6:23 PM

## 2019-02-22 NOTE — Anesthesia Procedure Notes (Signed)
Epidural Patient location during procedure: OB Start time: 02/22/2019 4:46 PM End time: 02/22/2019 4:52 PM  Staffing Anesthesiologist: Shelton Silvas, MD Performed: anesthesiologist   Preanesthetic Checklist Completed: patient identified, site marked, surgical consent, pre-op evaluation, timeout performed, IV checked, risks and benefits discussed and monitors and equipment checked  Epidural Patient position: sitting Prep: ChloraPrep Patient monitoring: heart rate, continuous pulse ox and blood pressure Approach: midline Location: L3-L4 Injection technique: LOR saline  Needle:  Needle type: Tuohy  Needle gauge: 17 G Needle length: 9 cm Catheter type: closed end flexible Catheter size: 20 Guage Test dose: negative and 1.5% lidocaine  Assessment Events: blood not aspirated, injection not painful, no injection resistance and no paresthesia  Additional Notes LOR @ 4.5  Patient identified. Risks/Benefits/Options discussed with patient including but not limited to bleeding, infection, nerve damage, paralysis, failed block, incomplete pain control, headache, blood pressure changes, nausea, vomiting, reactions to medications, itching and postpartum back pain. Confirmed with bedside nurse the patient's most recent platelet count. Confirmed with patient that they are not currently taking any anticoagulation, have any bleeding history or any family history of bleeding disorders. Patient expressed understanding and wished to proceed. All questions were answered. Sterile technique was used throughout the entire procedure. Please see nursing notes for vital signs. Test dose was given through epidural catheter and negative prior to continuing to dose epidural or start infusion. Warning signs of high block given to the patient including shortness of breath, tingling/numbness in hands, complete motor block, or any concerning symptoms with instructions to call for help. Patient was given instructions on  fall risk and not to get out of bed. All questions and concerns addressed with instructions to call with any issues or inadequate analgesia.    Reason for block:procedure for pain

## 2019-02-22 NOTE — Anesthesia Preprocedure Evaluation (Addendum)
Anesthesia Evaluation  Patient identified by MRN, date of birth, ID band Patient awake    Reviewed: Allergy & Precautions, Patient's Chart, lab work & pertinent test results  Airway Mallampati: I       Dental no notable dental hx.    Pulmonary former smoker,    Pulmonary exam normal        Cardiovascular  Rhythm:Regular Rate:Normal     Neuro/Psych Anxiety Depression    GI/Hepatic GERD  Medicated,  Endo/Other    Renal/GU      Musculoskeletal   Abdominal Normal abdominal exam  (+)   Peds  Hematology   Anesthesia Other Findings   Reproductive/Obstetrics (+) Pregnancy                            Anesthesia Physical Anesthesia Plan  ASA: II  Anesthesia Plan: Epidural   Post-op Pain Management:    Induction:   PONV Risk Score and Plan:   Airway Management Planned:   Additional Equipment: None  Intra-op Plan:   Post-operative Plan:   Informed Consent: I have reviewed the patients History and Physical, chart, labs and discussed the procedure including the risks, benefits and alternatives for the proposed anesthesia with the patient or authorized representative who has indicated his/her understanding and acceptance.       Plan Discussed with:   Anesthesia Plan Comments: (Lab Results      Component                Value               Date                      WBC                      10.7 (H)            02/22/2019                HGB                      10.8 (L)            02/22/2019                HCT                      32.5 (L)            02/22/2019                MCV                      92.6                02/22/2019                PLT                      219                 02/22/2019             Pt progressing fast, explained relief may not be fully appreciated.   )       Anesthesia Quick Evaluation

## 2019-02-22 NOTE — Discharge Summary (Addendum)
Postpartum Discharge Summary     Patient Name: Kelli Bean DOB: Sep 10, 1986 MRN: [id number]  Date of admission: 02/22/2019 Delivering Provider: Zettie Cooley E   Date of discharge: 02/24/2019  Admitting diagnosis: CTX LESS THAN 5 MIN  Intrauterine pregnancy: [redacted]w[redacted]d    Secondary diagnosis:  Active Problems:   Labor and delivery indication for care or intervention   History of miscarriage   Not immune to rubella  Additional problems: desires sterilization     Discharge diagnosis: Term Pregnancy Delivered                                                                                                Post partum procedures: MMR Vx  Augmentation: AROM  Complications: None  Hospital course:  Onset of Labor With Vaginal Delivery     33y.o. yo [id number] admitted in Active Labor on 02/22/2019. Patient had an uncomplicated labor course and delivered shortly after AROM as follows:  Membrane Rupture Time/Date: 5:01 PM ,02/22/2019   Intrapartum Procedures: Episiotomy: None [1]                                         Lacerations:  None [1]  Patient had a delivery of a Viable infant. 02/22/2019  Information for the patient's newborn:  Kelli Bean, Kelli Bean[[id number]]Delivery Method: Vaginal, Spontaneous(Filed from Delivery Summary)   Pateint had an uncomplicated postpartum course.  She is ambulating, tolerating a regular diet, passing flatus, and urinating well. Patient is discharged home in stable condition on 02/24/19.  Magnesium Sulfate recieved: No BMZ received: No  Physical exam  Vitals:   02/23/19 0817 02/23/19 1419 02/23/19 2202 02/24/19 0606  BP: 118/70 115/75 107/67 107/62  Pulse: (!) 59 70 68 (!) 59  Resp: '16 16 17 16  ' Temp: 97.6 F (36.4 C) 98.1 F (36.7 C) 98.5 F (36.9 C) 97.8 F (36.6 C)  TempSrc: Oral Oral Oral Oral  SpO2:  100%    Weight:      Height:       General: alert, cooperative and no distress Lochia: appropriate Uterine Fundus:  firm Incision: N/A DVT Evaluation: No evidence of DVT seen on physical exam. Negative Homan's sign. No cords or calf tenderness. No significant calf/ankle edema. Labs: Lab Results  Component Value Date   WBC 10.7 (H) 02/22/2019   HGB 10.8 (L) 02/22/2019   HCT 32.5 (L) 02/22/2019   MCV 92.6 02/22/2019   PLT 219 02/22/2019   CMP Latest Ref Rng & Units 11/08/2012  Glucose 70 - 99 mg/dL 94  BUN 6 - 23 mg/dL 13  Creatinine 0.50 - 1.10 mg/dL 0.65  Sodium 135 - 145 mEq/L 141  Potassium 3.5 - 5.1 mEq/L 3.7  Chloride 96 - 112 mEq/L 105  CO2 19 - 32 mEq/L 25  Calcium 8.4 - 10.5 mg/dL 8.8    Discharge instruction: per After Visit Summary and "Baby and Me Booklet".  After visit meds:  Allergies as of 02/24/2019  No Known Allergies     Medication List    TAKE these medications   ferrous sulfate 325 (65 FE) MG tablet Take 1 tablet (325 mg total) by mouth daily with breakfast.   multivitamin capsule Take 1 capsule by mouth daily.   omeprazole 20 MG capsule Commonly known as:  PriLOSEC Take 1 capsule (20 mg total) by mouth 2 (two) times daily before a meal.   Wrist Brace/Left Small Misc 1 Device by Does not apply route daily.   Wrist Brace/Right Small Misc 1 Device by Does not apply route daily.       Diet: routine diet  Activity: Advance as tolerated. Pelvic rest for 6 weeks.   Outpatient follow up:1-2wks to sign BTL papers and have preop visit Follow up Appt: Future Appointments  Date Time Provider [place]  03/04/2019  7:30 AM [facility] 1 [facility] None   Follow up Visit:  Please schedule this patient for Postpartum visit in: 1-2 wks to sign BTL papers and have pre-op visit with the following provider: MD For C/S patients schedule nurse incision check in weeks 2 weeks: no Low risk pregnancy complicated by: none Delivery mode:  SVD Anticipated Birth Control:  Plans Interval BTL PP Procedures needed: pp BTL  Schedule Integrated [place] visit: no  Newborn  Data: Live born female  Birth Weight: 3115gm (6lb 13.9oz)  APGAR: 77, 9  Newborn Delivery   Birth date/time:  02/22/2019 17:29:00 Delivery type:  Vaginal, Spontaneous     Baby Feeding: Breast Disposition:home   02/24/2019 Wilber Oliphant, MD  CNM attestation I have seen and examined this patient and agree with above documentation in the resident's note.   LUDIA Bean is a 33 y.o. [id number] s/p SVD.   Pain is well controlled.  Plan for birth control is bilateral tubal ligation- interval.  Method of Feeding: breast  PE:  BP 107/62 (BP Location: Right Arm)   Pulse (!) 59   Temp 97.8 F (36.6 C) (Oral)   Resp 16   Ht '5\' 3"'  (1.6 m)   Wt 77.1 kg   LMP 05/23/2018   SpO2 100%   Breastfeeding Unknown   BMI 30.11 kg/m  Fundus firm  No results for input(s): HGB, HCT in the last 72 hours.   Plan: discharge today - postpartum care discussed - f/u clinic in 1-2 weeks for preop BTL/sign papers  Myrtis Ser, CNM 4:23 PM

## 2019-02-23 LAB — RPR: RPR Ser Ql: NONREACTIVE

## 2019-02-23 NOTE — Lactation Note (Signed)
This note was copied from a baby's chart. Lactation Consultation Note Baby 40 hrs old. Mom states baby has no interest in BF at this time. She put the baby to the breast, baby will lick but not open mouth to feed. Mom states she has seen some bubbles in his mouth but hasn't spit anything up. Baby resting soundly. W/gloved finger LC attempted to get baby to suckle wouldn't open mouth. Newborn behavior, feeding habits, STS, I&O, breast massage, feeding positioning, support, supply and demand discussed. Mom encouraged to feed baby 8-12 times/24 hours and with feeding cues. Mom encouraged to waken baby for feeds if hasn't cued in 3 hrs. Mom demonstrated hand expression w/colostrum noted. Mom has everted nipples. Mom has personal DEBP at hospital if baby doesn't start wanting to feed soon she will start pumping. Encouraged to call mom for assistance or concerns. Lactation brochure given.  Patient Name: Kelli Bean FIEPP'I Date: 02/23/2019 Reason for consult: Initial assessment;Term   Maternal Data Has patient been taught Hand Expression?: Yes Does the patient have breastfeeding experience prior to this delivery?: Yes  Feeding    LATCH Score       Type of Nipple: Everted at rest and after stimulation  Comfort (Breast/Nipple): Soft / non-tender        Interventions Interventions: Breast compression;Breast feeding basics reviewed;Hand express;Breast massage;Position options;Support pillows  Lactation Tools Discussed/Used     Consult Status Consult Status: Follow-up Date: 02/23/19(in pm) Follow-up type: In-patient    Theodoro Kalata 02/23/2019, 6:19 AM

## 2019-02-23 NOTE — Anesthesia Postprocedure Evaluation (Signed)
Anesthesia Post Note  Patient: Kelli Bean  Procedure(s) Performed: AN AD HOC LABOR EPIDURAL     Patient location during evaluation: Mother Baby Anesthesia Type: Epidural Level of consciousness: awake and alert and oriented Pain management: satisfactory to patient Vital Signs Assessment: post-procedure vital signs reviewed and stable Respiratory status: spontaneous breathing and nonlabored ventilation Cardiovascular status: stable Postop Assessment: no headache, no backache, no signs of nausea or vomiting, adequate PO intake, patient able to bend at knees, able to ambulate and no apparent nausea or vomiting (patient up walking) Anesthetic complications: no    Last Vitals:  Vitals:   02/23/19 0456 02/23/19 0817  BP: 109/72 118/70  Pulse: 70 (!) 59  Resp: 18 16  Temp: 36.7 C 36.4 C  SpO2: 100%     Last Pain:  Vitals:   02/23/19 0818  TempSrc:   PainSc: 7    Pain Goal:                   Kelli Bean

## 2019-02-23 NOTE — Progress Notes (Signed)
Post Partum Day 1 Subjective: up ad lib, voiding, tolerating PO, + flatus and abdominal cramping and vaginal pain  Objective: Blood pressure 109/72, pulse 70, temperature 98.1 F (36.7 C), temperature source Oral, resp. rate 18, height 5\' 3"  (1.6 m), weight 77.1 kg, last menstrual period 05/23/2018, SpO2 100 %, unknown if currently breastfeeding.  Physical Exam:  General: alert, cooperative and no distress Lochia: appropriate Uterine Fundus: firm Incision: n/a DVT Evaluation: No evidence of DVT seen on physical exam.  Recent Labs    02/22/19 1610  HGB 10.8*  HCT 32.5*    Assessment/Plan: Plan for discharge tomorrow, Breastfeeding and Contraception interval BTL but considering Depo or POPs before discharge   LOS: 1 day   Fatima Blank 02/23/2019, 7:57 AM

## 2019-02-23 NOTE — Progress Notes (Addendum)
MOB was referred for history of depression/anxiety. * Referral screened out by Clinical Social Worker because none of the following criteria appear to apply: ~ History of anxiety/depression during this pregnancy, or of post-partum depression following prior delivery. ~ Diagnosis of anxiety and/or depression within last 3 years OR * MOB's symptoms currently being treated with medication and/or therapy. Per further chart review MOB has active status for Bupropion, buspirone, and Sertaline.   MOB also scored 6 on Edinburgh with no concerns to CSW.      Virgie Dad Ava Deguire, MSW, LCSW-A [facility] [phone]

## 2019-02-24 NOTE — Lactation Note (Signed)
This note was copied from a baby's chart. Lactation Consultation Note  Patient Name: Kelli Bean GLOVF'I Date: 02/24/2019  P3, 50 hour female infant, weight loss -1%. Per mom, infant had 5 stools and one void recently.  Per mom, infant is cluster feeding,  breastfeeding is going well and  most feedings are 20 to 25 minutes. Per mom, breast are little sore but no pain when infant is latching  to breast . LC informed Nurse and mom will be given coconut oil.  Mom knows to breastfeed by hunger cues, on demand, 8 to 12 times within 24 hours. Mom knows to call Nurse or [place] if she has any questions, concerns or need assistance with latching infant to breast.    Maternal Data    Feeding Feeding Type: Breast Fed  LATCH Score                   Interventions    Lactation Tools Discussed/Used     Consult Status      [name] 02/24/2019, 2:09 AM

## 2019-02-24 NOTE — Lactation Note (Signed)
This note was copied from a baby's chart. Lactation Consultation Note:  Mother requesting to see [facility] piror to discharge.  Mother concerned that infant isnt getting on the breast deep enough.  Assist mother with good pillow support on the sofa.  Mother taught to latch infant with asymmetrical latch.  Infant sustained latch for 15 mins with observed swallows.  Mother taught to do breast compression while breastfeeding.  Observed mothers nipple round when infant released the breast.   Mother was given comfort gels.  Advised mother to be cautious about getting enough rest.  Reviewed S/S of Mastitis.  Mother to cue base feed and to feed infant 8-12 times in 24 hours or more. Discussed cluster feeding and treatment and prevention of engorgement.  Mother receptive to all teaching.  Mother is aware of phone number to [facility] office for questions or concerns.   Patient Name: Kelli Bean DGLOV'F Date: 02/24/2019 Reason for consult: Follow-up assessment   Maternal Data    Feeding Feeding Type: Breast Fed  LATCH Score Latch: Grasps breast easily, tongue down, lips flanged, rhythmical sucking.  Audible Swallowing: Spontaneous and intermittent  Type of Nipple: Everted at rest and after stimulation  Comfort (Breast/Nipple): Soft / non-tender  Hold (Positioning): Assistance needed to correctly position infant at breast and maintain latch.  LATCH Score: 9  Interventions Interventions: Assisted with latch;Skin to skin;Breast massage;Hand express;Breast compression;Adjust position;Support pillows;Position options;Expressed milk;Comfort gels;Hand pump  Lactation Tools Discussed/Used     Consult Status Consult Status: Complete    Darla Lesches 02/24/2019, 12:51 PM

## 2019-02-24 NOTE — Discharge Instructions (Signed)
Home Care Instructions for Mom  Activity  · Gradually return to your regular activities.  · Let yourself rest. Nap while your baby sleeps.  · Avoid lifting anything that is heavier than 10 lb (4.5 kg) until your health care provider says it is okay.  · Avoid activities that take a lot of effort and energy (are strenuous) until approved by your health care provider. Walking at a slow-to-moderate pace is usually safe.  · If you had a cesarean delivery:  ? Do not vacuum, climb stairs, or drive a car for 4-6 weeks.  ? Have someone help you at home until you feel like you can do your usual activities yourself.  ? Do exercises as told by your health care provider, if this applies.  Vaginal bleeding  You may continue to bleed for 4-6 weeks after delivery. Over time, the amount of blood usually decreases and the color of the blood usually gets lighter. However, the flow of bright red blood may increase if you have been too active. If you need to use more than one pad in an hour because your pad gets soaked, or if you pass a large clot:  · Lie down.  · Raise your feet.  · Place a cold compress on your lower abdomen.  · Rest.  · Call your health care provider.  If you are breastfeeding, your period should return anytime between 8 weeks after delivery and the time that you stop breastfeeding. If you are not breastfeeding, your period should return 6-8 weeks after delivery.  Perineal care  The perineal area, or perineum, is the part of your body between your thighs. After delivery, this area needs special care. Follow these instructions as told by your health care provider.  · Take warm tub baths for 15-20 minutes.  · Use medicated pads and pain-relieving sprays and creams as told.  · Do not use tampons or douches until vaginal bleeding has stopped.  · Each time you go to the bathroom:  ? Use a peri bottle.  ? Change your pad.  ? Use towelettes in place of toilet paper until your stitches have healed.  · Do Kegel exercises  every day. Kegel exercises help to maintain the muscles that support the vagina, bladder, and bowels. You can do these exercises while you are standing, sitting, or lying down. To do Kegel exercises:  ? Tighten the muscles of your abdomen and the muscles that surround your birth canal.  ? Hold for a few seconds.  ? Relax.  ? Repeat until you have done this 5 times in a row.  · To prevent hemorrhoids from developing or getting worse:  ? Drink enough fluid to keep your urine clear or pale yellow.  ? Avoid straining when having a bowel movement.  ? Take over-the-counter medicines and stool softeners as told by your health care provider.  Breast care  · Wear a tight-fitting bra.  · Avoid taking over-the-counter pain medicine for breast discomfort.  · Apply ice to the breasts to help with discomfort as needed:  ? Put ice in a plastic bag.  ? Place a towel between your skin and the bag.  ? Leave the ice on for 20 minutes or as told by your health care provider.  Nutrition  · Eat a well-balanced diet.  · Do not try to lose weight quickly by cutting back on calories.  · Take your prenatal vitamins until your postpartum checkup or until your health care provider tells   you to stop.  Postpartum depression  You may find yourself crying for no apparent reason and unable to cope with all of the changes that come with having a newborn. This mood is called postpartum depression. Postpartum depression happens because your hormone levels change after delivery. If you have postpartum depression, get support from your partner, friends, and family. If the depression does not go away on its own after several weeks, contact your health care provider.  Breast self-exam    Do a breast self-exam each month, at the same time of the month. If you are breastfeeding, check your breasts just after a feeding, when your breasts are less full. If you are breastfeeding and your period has started, check your breasts on day 5, 6, or 7 of your  period.  Report any lumps, bumps, or discharge to your health care provider. Know that breasts are normally lumpy if you are breastfeeding. This is temporary, and it is not a health risk.  Intimacy and sexuality  Avoid sexual activity for at least 3-4 weeks after delivery or until the brownish-red vaginal flow is completely gone. If you want to avoid pregnancy, use some form of birth control. You can get pregnant after delivery, even if you have not had your period.  Contact a health care provider if:  · You feel unable to cope with the changes that a child brings to your life, and these feelings do not go away after several weeks.  · You notice a lump, a bump, or discharge on your breast.  Get help right away if:  · Blood soaks your pad in 1 hour or less.  · You have:  ? Severe pain or cramping in your lower abdomen.  ? A bad-smelling vaginal discharge.  ? A fever that is not controlled by medicine.  ? A fever, and an area of your breast is red and sore.  ? Pain or redness in your calf.  ? Sudden, severe chest pain.  ? Shortness of breath.  ? Painful or bloody urination.  ? Problems with your vision.  ? You vomit for 12 hours or longer.  ? You develop a severe headache.  ? You have serious thoughts about hurting yourself, your child, or anyone else.  This information is not intended to replace advice given to you by your health care provider. Make sure you discuss any questions you have with your health care provider.  Document Released: 09/02/2000 Document Revised: 11/01/2017 Document Reviewed: 03/09/2015  Elsevier Interactive Patient Education © 2019 Elsevier Inc.

## 2019-03-04 ENCOUNTER — Other Ambulatory Visit (HOSPITAL_COMMUNITY): Admission: RE | Admit: 2019-03-04 | Payer: Medicaid Other | Source: Ambulatory Visit

## 2019-03-06 ENCOUNTER — Inpatient Hospital Stay (HOSPITAL_COMMUNITY): Payer: Medicaid Other

## 2019-03-11 ENCOUNTER — Other Ambulatory Visit: Payer: Self-pay

## 2019-03-11 ENCOUNTER — Encounter: Payer: Self-pay | Admitting: Obstetrics and Gynecology

## 2019-03-11 ENCOUNTER — Ambulatory Visit (INDEPENDENT_AMBULATORY_CARE_PROVIDER_SITE_OTHER): Payer: Medicaid Other | Admitting: Obstetrics and Gynecology

## 2019-03-11 VITALS — BP 138/85 | HR 66 | Wt 145.5 lb

## 2019-03-11 DIAGNOSIS — Z3009 Encounter for other general counseling and advice on contraception: Secondary | ICD-10-CM

## 2019-03-11 NOTE — Progress Notes (Signed)
33 yo [id number] here for BTL consultation. Patient s/p SVD 2 weeks ago and reports feeling well. She denies any pelvic/abdominal pain. She receives ample assistance from her parents and in-laws. Patient denies any concerns regarding postpartum depression. She is breastfeeding. She has remained abstinent. Patient desires permanent sterilization  Blood pressure 138/85, pulse 66, weight 145 lb 8 oz (66 kg), last menstrual period 05/23/2018, currently breastfeeding. GENERAL: Well-developed, well-nourished female in no acute distress.  ABDOMEN: Soft, nontender, nondistended. No organomegaly. EXTREMITIES: No cyanosis, clubbing, or edema, 2+ distal pulses.  A/P 33 yo P3043 with a desire for permanent sterilization -  Other reversible forms of contraception were discussed with patient; she declines all other modalities.  -  Risks of procedure discussed with patient including permanence of method, bleeding, infection, injury to surrounding organs and need for additional procedures including laparotomy, risk of regret.  Failure risk of 0.5-1% with increased risk of ectopic gestation if pregnancy occurs was also discussed with patient.   - Patient signed Medicaid form today - Information will be sent to surgical scheduler  - Follow up in 4 weeks for postpartum visit

## 2019-03-11 NOTE — Progress Notes (Signed)
Pt had vaginal delivery on 02-22-19, currenlty breastfeeding. Edinburgh score=0 Pt is in the office to sign BTL.

## 2019-04-08 ENCOUNTER — Other Ambulatory Visit: Payer: Self-pay

## 2019-04-08 ENCOUNTER — Encounter: Payer: Self-pay | Admitting: Obstetrics and Gynecology

## 2019-04-08 ENCOUNTER — Ambulatory Visit (INDEPENDENT_AMBULATORY_CARE_PROVIDER_SITE_OTHER): Payer: Medicaid Other | Admitting: Obstetrics and Gynecology

## 2019-04-08 NOTE — Progress Notes (Signed)
TELEHEALTH POSTPARTUM VIRTUAL VIDEO VISIT ENCOUNTER NOTE   [facility]   I connected with Kelli Bean on 04/08/19 at  9:30 AM EDT by WebEx Video Encounter at home and verified that I am speaking with the correct person using two identifiers.    I discussed the limitations, risks, security and privacy concerns of performing an evaluation and management service virtually and the availability of in person appointments. I also discussed with the patient that there may be a patient responsible charge related to this service. The patient expressed understanding and agreed to proceed.  Chief Complaint: Postpartum Visit  History of Present Illness: Kelli Chacolizabeth P Bean is a 33 y.o. African-American [id number] being evaluated for postpartum followup.    She is s/p normal spontaneous vaginal delivery on 02/22/19 at 39 weeks; she was discharged to home on 02/24/19. Pregnancy without any complication. Baby is doing well.  Patient is currently without any complaints  Vaginal bleeding or discharge: No  Intercourse: No  Contraception: bilateral tubal ligation surgery scheduled for 05/08/2019 Mode of feeding infant: Bottle PP depression s/s: No .  Any bowel or bladder issues: No  Pap smear: no abnormalities (date: 03/05/2018)  Review of Systems:  Her 12 point review of systems is negative or as noted in the History of Present Illness.  Patient Active Problem List   Diagnosis Date Noted  . Labor and delivery indication for care or intervention 02/22/2019  . Supervision of other normal pregnancy, antepartum 09/25/2018  . Anxiety disorder 05/15/2016  . Depression 05/15/2016  . History of ectopic pregnancy 05/23/2014  . 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 Not immune to rubella 08/01/2012  . History of miscarriage 07/30/2012    Medications Kelli Bean had no medications administered during this visit. Current Outpatient  Medications  Medication Sig Dispense Refill  . Multiple Vitamin (MULTIVITAMIN) capsule Take 1 capsule by mouth daily.    Marland Kitchen. docusate sodium (COLACE) 100 MG capsule Take 100 mg by mouth daily as needed for mild constipation.    Clinical research associate. Elastic Bandages & Supports (WRIST BRACE/LEFT SMALL) MISC 1 Device by Does not apply route daily. (Patient not taking: Reported on 02/20/2019) 1 each 0  . Elastic Bandages & Supports (WRIST BRACE/RIGHT SMALL) MISC 1 Device by Does not apply route daily. (Patient not taking: Reported on 02/20/2019) 1 each 0  . ferrous sulfate 325 (65 FE) MG tablet Take 1 tablet (325 mg total) by mouth daily with breakfast. (Patient not taking: Reported on 03/11/2019) 30 tablet 3  . omeprazole (PRILOSEC) 20 MG capsule Take 1 capsule (20 mg total) by mouth 2 (two) times daily before a meal. (Patient not taking: Reported on 02/20/2019) 60 capsule 5   No current facility-administered medications for this visit.     Allergies Patient has no known allergies.  Physical Exam:   General:  Alert, oriented and cooperative. Patient is in no acute distress.  Mental Status: Normal mood and affect. Normal behavior. Normal judgment and thought content.   Respiratory: Normal respiratory effort noted, no problems with respiration noted  Rest of physical exam deferred due to type of encounter  PP Depression Screening:   Edinburgh Postnatal Depression Scale Screening Tool 04/08/2019 03/11/2019 02/23/2019  I have been able to laugh and see the funny side of things. 0 0 0  I have looked forward with enjoyment to things. 0 0 0  I have blamed myself unnecessarily when things went wrong. 0 0 1  I have been anxious or worried for no good reason. 0 0 1  I have felt scared or panicky for no good reason. 0 - 1  Things have been getting on top of me. 0 0 1  I have been so unhappy that I have had difficulty sleeping. 0 0 0  I have felt sad or miserable. 0 0 1  I have been so unhappy that I have been crying. 0 0 1  The  thought of harming myself has occurred to me. 0 0 0  Edinburgh Postnatal Depression Scale Total 0 - 6     Assessment:Patient is a 33 y.o. [id number] who is 6 weeks postpartum from a normal spontaneous vaginal delivery.  She is doing well.   Plan: Normal postpartum visit Patient is medically cleared to resume all activities of daily living Patient declined contraception at this time and plans to remain abstinent until sterilization Patient scheduled for [facility] BTL on 8/19. Other reversible forms of contraception were discussed with patient; she declines all other modalities.  Risks of procedure discussed with patient including permanence of method, bleeding, infection, injury to surrounding organs and need for additional procedures including laparotomy, risk of regret.  Failure risk of 0.5-1% with increased risk of ectopic gestation if pregnancy occurs was also discussed with patient.       RTC for post op check following [facility] BTL  I discussed the assessment and treatment plan with the patient. The patient was provided an opportunity to ask questions and all were answered. The patient agreed with the plan and demonstrated an understanding of the instructions.   The patient was advised to call back or seek an in-person evaluation/go to the ED for any concerning postpartum symptoms.  I provided 15 minutes of face-to-face time during this encounter.   Mora Bellman, MD [facility] [place]

## 2019-04-17 ENCOUNTER — Other Ambulatory Visit: Payer: Self-pay | Admitting: Obstetrics and Gynecology

## 2019-04-30 ENCOUNTER — Encounter (HOSPITAL_BASED_OUTPATIENT_CLINIC_OR_DEPARTMENT_OTHER): Payer: Self-pay | Admitting: *Deleted

## 2019-05-03 ENCOUNTER — Other Ambulatory Visit: Payer: Self-pay

## 2019-05-03 ENCOUNTER — Encounter (HOSPITAL_BASED_OUTPATIENT_CLINIC_OR_DEPARTMENT_OTHER): Payer: Self-pay | Admitting: *Deleted

## 2019-05-04 ENCOUNTER — Other Ambulatory Visit (HOSPITAL_COMMUNITY): Payer: Medicaid Other | Attending: Obstetrics and Gynecology

## 2019-05-07 ENCOUNTER — Encounter (HOSPITAL_COMMUNITY): Payer: Self-pay | Admitting: Anesthesiology

## 2019-05-07 NOTE — H&P (Signed)
Patient did not have covid testing pre-op. Case cancelled per current protocol  ROS Physical Exam  Dakiya Puopolo 05/08/2019, 1:06 PM

## 2019-05-08 ENCOUNTER — Ambulatory Visit (HOSPITAL_BASED_OUTPATIENT_CLINIC_OR_DEPARTMENT_OTHER)
Admission: RE | Admit: 2019-05-08 | Payer: Medicaid Other | Source: Ambulatory Visit | Admitting: Obstetrics and Gynecology

## 2019-05-08 HISTORY — DX: Gastro-esophageal reflux disease without esophagitis: K21.9

## 2019-05-08 SURGERY — LIGATION, FALLOPIAN TUBE, LAPAROSCOPIC
Anesthesia: Choice | Laterality: Bilateral

## 2019-10-04 ENCOUNTER — Other Ambulatory Visit: Payer: Medicaid Other

## 2019-10-07 ENCOUNTER — Ambulatory Visit: Payer: Medicaid Other | Attending: Internal Medicine

## 2019-10-07 DIAGNOSIS — Z20822 Contact with and (suspected) exposure to covid-19: Secondary | ICD-10-CM

## 2019-10-08 ENCOUNTER — Other Ambulatory Visit: Payer: Medicaid Other

## 2019-10-08 LAB — NOVEL CORONAVIRUS, NAA: SARS-CoV-2, NAA: NOT DETECTED

## 2019-12-28 ENCOUNTER — Ambulatory Visit: Payer: Medicaid Other | Attending: Internal Medicine

## 2019-12-28 ENCOUNTER — Other Ambulatory Visit: Payer: Self-pay

## 2019-12-28 DIAGNOSIS — Z23 Encounter for immunization: Secondary | ICD-10-CM

## 2019-12-28 NOTE — Progress Notes (Signed)
   Covid-19 Vaccination Clinic  Name:  Kelli Bean    MRN: [id number] DOB: 11-03-1985  12/28/2019  [name] was observed post Covid-19 immunization for 15 minutes without incident. She was provided with Vaccine Information Sheet and instruction to access the V-Safe system.   [name] was instructed to call 911 with any severe reactions post vaccine: Marland Kitchen Difficulty breathing  . Swelling of face and throat  . A fast heartbeat  . A bad rash all over body  . Dizziness and weakness   Immunizations Administered    Name Date Dose VIS Date Route   Pfizer COVID-19 Vaccine 12/28/2019  1:31 PM 0.3 mL 08/30/2019 Intramuscular   Manufacturer: [employer], [employer]   Lot: [id number]   NDC: [address]-5

## 2020-01-07 ENCOUNTER — Telehealth (INDEPENDENT_AMBULATORY_CARE_PROVIDER_SITE_OTHER): Payer: Medicaid Other | Admitting: Obstetrics

## 2020-01-07 NOTE — Progress Notes (Signed)
Patient did not answer nurse's call. 

## 2020-01-08 ENCOUNTER — Telehealth (INDEPENDENT_AMBULATORY_CARE_PROVIDER_SITE_OTHER): Payer: Medicaid Other | Admitting: Obstetrics

## 2020-01-08 ENCOUNTER — Encounter: Payer: Self-pay | Admitting: Obstetrics

## 2020-01-08 DIAGNOSIS — Z30011 Encounter for initial prescription of contraceptive pills: Secondary | ICD-10-CM | POA: Diagnosis not present

## 2020-01-08 DIAGNOSIS — Z3009 Encounter for other general counseling and advice on contraception: Secondary | ICD-10-CM

## 2020-01-08 MED ORDER — BALCOLTRA 0.1-20 MG-MCG(21) PO TABS: 1.0000 | ORAL_TABLET | Freq: Every day | ORAL | 11 refills | Status: DC

## 2020-01-08 NOTE — Progress Notes (Signed)
S/w pt for virtual visit for birth control consult. Pt is interested in [facility] pills. LMP 12-31-19

## 2020-01-08 NOTE — Progress Notes (Signed)
    GYNECOLOGY VIRTUAL VISIT ENCOUNTER NOTE  Provider location: Center for [facility] [place]   I connected with Army Chaco on 01/08/20 at  2:00 PM EDT by MyChart Video Encounter at home and verified that I am speaking with the correct person using two identifiers.   I discussed the limitations, risks, security and privacy concerns of performing an evaluation and management service virtually and the availability of in person appointments. I also discussed with the patient that there may be a patient responsible charge related to this service. The patient expressed understanding and agreed to proceed.   History:  Kelli Bean is a 34 y.o. [phone] female being evaluated today for contraceptive counseling and advice. She denies any abnormal vaginal discharge, bleeding, pelvic pain or other concerns.       Past Medical History:  Diagnosis Date  . Anxiety   . Depression    2008 on medication  . GERD (gastroesophageal reflux disease)    Past Surgical History:  Procedure Laterality Date  . NO PAST SURGERIES     The following portions of the patient's history were reviewed and updated as appropriate: allergies, current medications, past family history, past medical history, past social history, past surgical history and problem list.   Health Maintenance:  Normal pap and negative HRHPV on 03-05-2018.   Review of Systems:  Pertinent items noted in HPI and remainder of comprehensive ROS otherwise negative.  Physical Exam:   General:  Alert, oriented and cooperative. Patient appears to be in no acute distress.  Mental Status: Normal mood and affect. Normal behavior. Normal judgment and thought content.   Respiratory: Normal respiratory effort, no problems with respiration noted  Rest of physical exam deferred due to type of encounter  Labs and Imaging No results found for this or any previous visit (from the past 336 hour(s)). No results found.       Assessment and Plan:     1. Encounter for other general counseling and advice on contraception - wants OCP's  2. Encounter for initial prescription of contraceptive pills Rx: - Levonorgest-Eth Estrad-Fe Bisg (BALCOLTRA) 0.1-20 MG-MCG(21) TABS; Take 1 tablet by mouth daily.  Dispense: 28 tablet; Refill: 11       I discussed the assessment and treatment plan with the patient. The patient was provided an opportunity to ask questions and all were answered. The patient agreed with the plan and demonstrated an understanding of the instructions.   The patient was advised to call back or seek an in-person evaluation/go to the ED if the symptoms worsen or if the condition fails to improve as anticipated.  I provided 15 minutes of face-to-face time during this encounter.   Coral Ceo, MD Center for [facility] 01/08/2020

## 2020-01-20 ENCOUNTER — Ambulatory Visit: Payer: Medicaid Other | Attending: Internal Medicine

## 2020-01-20 DIAGNOSIS — Z23 Encounter for immunization: Secondary | ICD-10-CM

## 2020-01-20 NOTE — Progress Notes (Signed)
   Covid-19 Vaccination Clinic  Name:  Kelli Bean    MRN: [id number] DOB: [birth year]  01/20/2020  Kelli Bean was observed post Covid-19 immunization for 15 minutes without incident. She was provided with Vaccine Information Sheet and instruction to access the V-Safe system.   Kelli Bean was instructed to call 911 with any severe reactions post vaccine: Marland Kitchen Difficulty breathing  . Swelling of face and throat  . A fast heartbeat  . A bad rash all over body  . Dizziness and weakness   Immunizations Administered    Name Date Dose VIS Date Route   Pfizer COVID-19 Vaccine 01/20/2020 11:47 AM 0.3 mL 11/13/2018 Intramuscular   Manufacturer: [employer], [employer]   Lot: [id number]   NDC: [address]-6

## 2020-09-30 ENCOUNTER — Other Ambulatory Visit: Payer: Medicaid Other

## 2020-09-30 DIAGNOSIS — Z20822 Contact with and (suspected) exposure to covid-19: Secondary | ICD-10-CM

## 2020-10-01 LAB — SARS-COV-2, NAA 2 DAY TAT

## 2020-10-01 LAB — NOVEL CORONAVIRUS, NAA: SARS-CoV-2, NAA: NOT DETECTED

## 2021-09-01 ENCOUNTER — Other Ambulatory Visit: Payer: Self-pay

## 2021-09-01 ENCOUNTER — Other Ambulatory Visit (HOSPITAL_COMMUNITY)
Admission: RE | Admit: 2021-09-01 | Discharge: 2021-09-01 | Disposition: A | Discharge status: Home / Self Care | Payer: Medicaid Other | Source: Ambulatory Visit | Attending: Obstetrics | Admitting: Obstetrics

## 2021-09-01 ENCOUNTER — Encounter: Payer: Self-pay | Admitting: Obstetrics

## 2021-09-01 ENCOUNTER — Ambulatory Visit (INDEPENDENT_AMBULATORY_CARE_PROVIDER_SITE_OTHER): Payer: Medicaid Other | Admitting: Obstetrics

## 2021-09-01 VITALS — BP 111/74 | HR 74 | Ht 62.0 in | Wt 136.0 lb

## 2021-09-01 DIAGNOSIS — N944 Primary dysmenorrhea: Secondary | ICD-10-CM

## 2021-09-01 DIAGNOSIS — N898 Other specified noninflammatory disorders of vagina: Secondary | ICD-10-CM | POA: Diagnosis not present

## 2021-09-01 DIAGNOSIS — Z30011 Encounter for initial prescription of contraceptive pills: Secondary | ICD-10-CM

## 2021-09-01 DIAGNOSIS — Z113 Encounter for screening for infections with a predominantly sexual mode of transmission: Secondary | ICD-10-CM

## 2021-09-01 DIAGNOSIS — E569 Vitamin deficiency, unspecified: Secondary | ICD-10-CM

## 2021-09-01 DIAGNOSIS — N393 Stress incontinence (female) (male): Secondary | ICD-10-CM

## 2021-09-01 DIAGNOSIS — Z01419 Encounter for gynecological examination (general) (routine) without abnormal findings: Secondary | ICD-10-CM

## 2021-09-01 DIAGNOSIS — Z3009 Encounter for other general counseling and advice on contraception: Secondary | ICD-10-CM

## 2021-09-01 MED ORDER — IBUPROFEN 800 MG PO TABS: 800.0000 mg | ORAL_TABLET | Freq: Three times a day (TID) | ORAL | 5 refills | Status: DC | PRN

## 2021-09-01 MED ORDER — LO LOESTRIN FE 1 MG-10 MCG / 10 MCG PO TABS: 1.0000 | ORAL_TABLET | Freq: Every day | ORAL | 4 refills | Status: DC

## 2021-09-01 MED ORDER — VITAFOL ULTRA 29-0.6-0.4-200 MG PO CAPS: 1.0000 | ORAL_CAPSULE | Freq: Every day | ORAL | 4 refills | Status: DC

## 2021-09-01 NOTE — Progress Notes (Signed)
GYN presents for AEX/PAP/STD screening.  She would like to discuss BC options today. C/o incontinence and not fully emptying her bladder. Wants referral to UroGYN.

## 2021-09-01 NOTE — Progress Notes (Signed)
Subjective:        Kelli Bean is a 35 y.o. female here for a routine exam.  Current complaints: Leaking of urine when cough, sneeze, laughing, and other valsalva pressures.  Also c/o vaginal discharge.  Personal health questionnaire:  Is patient Ashkenazi Jewish, have a family history of breast and/or ovarian cancer: no Is there a family history of uterine cancer diagnosed at age < 55, gastrointestinal cancer, urinary tract cancer, family member who is a Personnel officer syndrome-associated carrier: no Is the patient overweight and hypertensive, family history of diabetes, personal history of gestational diabetes, preeclampsia or PCOS: no Is patient over 4, have PCOS,  family history of premature CHD under age 32, diabetes, smoke, have hypertension or peripheral artery disease:  no At any time, has a partner hit, kicked or otherwise hurt or frightened you?: no Over the past 2 weeks, have you felt down, depressed or hopeless?: no Over the past 2 weeks, have you felt little interest or pleasure in doing things?:no   Gynecologic History Patient's last menstrual period was 08/25/2021 (exact date). Contraception: none Last Pap: 2019. Results were: normal Last mammogram: n/a. Results were: n/a  Obstetric History OB History  Gravida Para Term Preterm AB Living  7 3 3   4 3   SAB IAB Ectopic Multiple Live Births  3   1 0 3    # Outcome Date GA Lbr Len/2nd Weight Sex Delivery Anes PTL Lv  7 Term 02/22/19 [redacted]w[redacted]d 08:31 / 00:28 6 lb 13.9 oz (3.115 kg) M Vag-Spont EPI  LIV  6 Term 05/15/16 [redacted]w[redacted]d 11:13 / 00:17 7 lb 11.5 oz (3.5 kg) M Vag-Spont EPI  LIV  5 Term 03/16/14 [redacted]w[redacted]d 12:02 / 00:33 6 lb 0.1 oz (2.725 kg) F Vag-Spont EPI  LIV     Birth Comments: Newborn Screen Barcode: [redacted]w[redacted]d Hgb, Normal, FA   4 Ectopic 2013 [redacted]w[redacted]d         3 SAB 2012 [redacted]w[redacted]d         2 SAB 2009 [redacted]w[redacted]d         1 SAB 2007            Past Medical History:  Diagnosis Date   Anxiety    Depression    2008 on medication    GERD (gastroesophageal reflux disease)     Past Surgical History:  Procedure Laterality Date   NO PAST SURGERIES       Current Outpatient Medications:    busPIRone (BUSPAR) 10 MG tablet, Take 10 mg by mouth 2 (two) times daily., Disp: , Rfl:    VYVANSE 30 MG capsule, Take 30 mg by mouth daily., Disp: , Rfl:    FLUoxetine (PROZAC) 20 MG tablet, Take 20 mg by mouth daily. (Patient not taking: Reported on 09/01/2021), Disp: , Rfl:    Levonorgest-Eth Estrad-Fe Bisg (BALCOLTRA) 0.1-20 MG-MCG(21) TABS, Take 1 tablet by mouth daily. (Patient not taking: Reported on 09/01/2021), Disp: 28 tablet, Rfl: 11   Multiple Vitamin (MULTIVITAMIN) capsule, Take 1 capsule by mouth daily. (Patient not taking: Reported on 09/01/2021), Disp: , Rfl:  No Known Allergies  Social History   Tobacco Use   Smoking status: Former   Smokeless tobacco: Never  Substance Use Topics   Alcohol use: Not Currently    Comment: socially    Family History  Problem Relation Age of Onset   Hypertension Mother    Hypertension Father    Cancer Maternal Grandmother    Diabetes Maternal Grandmother  Cancer Paternal Grandmother    Diabetes Paternal Grandmother       Review of Systems  Constitutional: negative for fatigue and weight loss Respiratory: negative for cough and wheezing Cardiovascular: negative for chest pain, fatigue and palpitations Gastrointestinal: negative for abdominal pain and change in bowel habits Musculoskeletal:negative for myalgias Neurological: negative for gait problems and tremors Behavioral/Psych: negative for abusive relationship, depression Endocrine: negative for temperature intolerance    Genitourinary: positive for vaginal discharge and urinary incontinence.  negative for abnormal menstrual periods, genital lesions, hot flashes, sexual problems  Integument/breast: negative for breast lump, breast tenderness, nipple discharge and skin lesion(s)    Objective:       BP 111/74     Pulse 74    Ht 5\' 2"  (1.575 m)    Wt 136 lb (61.7 kg)    LMP 08/25/2021 (Exact Date)    BMI 24.87 kg/m  General:   Alert and no distress  Skin:   no rash or abnormalities  Lungs:   clear to auscultation bilaterally  Heart:   regular rate and rhythm, S1, S2 normal, no murmur, click, rub or gallop  Breasts:   normal without suspicious masses, skin or nipple changes or axillary nodes  Abdomen:  normal findings: no organomegaly, soft, non-tender and no hernia  Pelvis:  External genitalia: normal general appearance Urinary system: urethral meatus normal and bladder without fullness, nontender Vaginal: normal without tenderness, induration or masses Cervix: normal appearance Adnexa: normal bimanual exam Uterus: anteverted and non-tender, normal size   Lab Review Urine pregnancy test Labs reviewed yes Radiologic studies reviewed no  I have spent a total of 20 minutes of face-to-face time, excluding clinical staff time, reviewing notes and preparing to see patient, ordering tests and/or medications, and counseling the patient.   Assessment:    1. Encounter for routine gynecological examination with Papanicolaou smear of cervix Rx - Cytology - PAP( [place])  2. Vaginal discharge Rx: - Cervicovaginal ancillary only( [place])  3. Screen for STD (sexually transmitted disease) Rx: - Hepatitis C Antibody - RPR  4. SUI (stress urinary incontinence, female) Rx: - Ambulatory referral to Urogynecology     Plan:    Education reviewed: calcium supplements, depression evaluation, low fat, low cholesterol diet, safe sex/STD prevention, self breast exams, smoking cessation, and weight bearing exercise. Contraception: abstinence. Follow up in: 1 year.     Orders Placed This Encounter  Procedures   Hepatitis C Antibody   RPR   Ambulatory referral to Urogynecology    Referral Priority:   Routine    Referral Type:   Consultation    Referral Reason:   Specialty Services Required     Requested Specialty:   Urology    Number of Visits Requested:   1    14/03/2021, MD 09/01/2021 4:02 PM

## 2021-09-02 LAB — CERVICOVAGINAL ANCILLARY ONLY
Bacterial Vaginitis (gardnerella): POSITIVE — AB
Candida Glabrata: NEGATIVE
Candida Vaginitis: NEGATIVE
Chlamydia: NEGATIVE
Comment: NEGATIVE
Comment: NEGATIVE
Comment: NEGATIVE
Comment: NEGATIVE
Comment: NEGATIVE
Comment: NORMAL
Neisseria Gonorrhea: NEGATIVE
Trichomonas: NEGATIVE

## 2021-09-02 LAB — HEPATITIS C ANTIBODY: Hep C Virus Ab: <0.1 s/co ratio (ref 0.0–0.9)

## 2021-09-02 LAB — RPR: RPR Ser Ql: NONREACTIVE

## 2021-09-03 ENCOUNTER — Other Ambulatory Visit: Payer: Self-pay | Admitting: Obstetrics

## 2021-09-03 DIAGNOSIS — B9689 Other specified bacterial agents as the cause of diseases classified elsewhere: Secondary | ICD-10-CM

## 2021-09-03 DIAGNOSIS — N760 Acute vaginitis: Secondary | ICD-10-CM

## 2021-09-03 MED ORDER — METRONIDAZOLE 500 MG PO TABS: 500.0000 mg | ORAL_TABLET | Freq: Two times a day (BID) | ORAL | 2 refills | Status: DC

## 2021-09-07 ENCOUNTER — Other Ambulatory Visit: Payer: Self-pay

## 2021-09-07 ENCOUNTER — Emergency Department (HOSPITAL_BASED_OUTPATIENT_CLINIC_OR_DEPARTMENT_OTHER): Payer: Medicaid Other

## 2021-09-07 ENCOUNTER — Emergency Department (HOSPITAL_BASED_OUTPATIENT_CLINIC_OR_DEPARTMENT_OTHER)
Admission: EM | Admit: 2021-09-07 | Discharge: 2021-09-07 | Disposition: A | Discharge status: Home / Self Care | Payer: Medicaid Other | Attending: Emergency Medicine | Admitting: Emergency Medicine

## 2021-09-07 ENCOUNTER — Encounter (HOSPITAL_BASED_OUTPATIENT_CLINIC_OR_DEPARTMENT_OTHER): Payer: Self-pay | Admitting: *Deleted

## 2021-09-07 ENCOUNTER — Emergency Department (HOSPITAL_BASED_OUTPATIENT_CLINIC_OR_DEPARTMENT_OTHER): Payer: Medicaid Other | Admitting: Radiology

## 2021-09-07 DIAGNOSIS — R0781 Pleurodynia: Secondary | ICD-10-CM | POA: Diagnosis not present

## 2021-09-07 DIAGNOSIS — Z87891 Personal history of nicotine dependence: Secondary | ICD-10-CM | POA: Insufficient documentation

## 2021-09-07 DIAGNOSIS — R079 Chest pain, unspecified: Secondary | ICD-10-CM

## 2021-09-07 LAB — BASIC METABOLIC PANEL
Anion gap: 5 (ref 5–15)
BUN: 10 mg/dL (ref 6–20)
CO2: 27 mmol/L (ref 22–32)
Calcium: 8.5 mg/dL — ABNORMAL LOW (ref 8.9–10.3)
Chloride: 107 mmol/L (ref 98–111)
Creatinine, Ser: 0.65 mg/dL (ref 0.44–1.00)
GFR, Estimated: >60 mL/min (ref >60)
Glucose, Bld: 91 mg/dL (ref 70–99)
Potassium: 4.2 mmol/L (ref 3.5–5.1)
Sodium: 139 mmol/L (ref 135–145)

## 2021-09-07 LAB — CYTOLOGY - PAP
Comment: NEGATIVE
Comment: NEGATIVE
Diagnosis: HIGH — AB
HPV 16: NEGATIVE
HPV 18 / 45: NEGATIVE
High risk HPV: POSITIVE — AB

## 2021-09-07 LAB — CBC
HCT: 41.1 % (ref 36.0–46.0)
Hemoglobin: 13.4 g/dL (ref 12.0–15.0)
MCH: 31.4 pg (ref 26.0–34.0)
MCHC: 32.6 g/dL (ref 30.0–36.0)
MCV: 96.3 fL (ref 80.0–100.0)
Platelets: 247 10*3/uL (ref 150–400)
RBC: 4.27 MIL/uL (ref 3.87–5.11)
RDW: 13 % (ref 11.5–15.5)
WBC: 6.2 10*3/uL (ref 4.0–10.5)
nRBC: 0 % (ref 0.0–0.2)

## 2021-09-07 LAB — TROPONIN I (HIGH SENSITIVITY)
Troponin I (High Sensitivity): <2 ng/L (ref <18)
Troponin I (High Sensitivity): <2 ng/L (ref <18)

## 2021-09-07 LAB — PREGNANCY, URINE: Preg Test, Ur: NEGATIVE

## 2021-09-07 MED ORDER — KETOROLAC TROMETHAMINE 15 MG/ML IJ SOLN
15.0000 mg | Freq: Once | INTRAMUSCULAR | Status: AC
  Administered 2021-09-07: 13:00:00 15 mg via INTRAVENOUS
  Filled 2021-09-07: qty 1

## 2021-09-07 MED ORDER — LACTATED RINGERS IV BOLUS
500.0000 mL | Freq: Once | INTRAVENOUS | Status: AC
  Administered 2021-09-07: 13:00:00 500 mL via INTRAVENOUS

## 2021-09-07 MED ORDER — IOHEXOL 350 MG/ML SOLN
100.0000 mL | Freq: Once | INTRAVENOUS | Status: AC | PRN
  Administered 2021-09-07: 13:00:00 80 mL via INTRAVENOUS

## 2021-09-07 NOTE — ED Provider Notes (Signed)
[facility] DEPT Provider Note   CSN: [id number] Arrival date & time: 09/07/21  1008     History Chief Complaint  Patient presents with   Chest Pain    Kelli Bean is a 35 y.o. female.  The history is provided by the patient.  Chest Pain Pain location:  R chest Pain quality: sharp   Pain radiates to:  Does not radiate Pain severity:  Mild Onset quality:  Sudden Duration:  8 hours Timing:  Constant Progression:  Improving Chronicity:  New Context: breathing, movement and stress   Context: not drug use, not eating, not at rest and not trauma   Relieved by:  None tried Worsened by:  Deep breathing, certain positions and movement Ineffective treatments:  None tried Associated symptoms: no abdominal pain, no anorexia, no anxiety, no back pain, no cough, no diaphoresis, no dizziness, no fatigue, no fever, no headache, no nausea, no near-syncope, no numbness, no orthopnea, no palpitations, no shortness of breath, no vomiting and no weakness   Risk factors: birth control   Risk factors: no high cholesterol, no hypertension, not pregnant, no prior DVT/PE and no smoking   Patient presents for chest pain.  Onset was 430 this morning.  She describes it as sharp and nonradiating.  It is located on the right side of her chest.  Pain is worsened with movements.  She is currently on her menstrual period.  She was recently started on birth control.  Her father passed away from MI at the age of 38.  She has no other known risk factors for ACS.  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   Not immune to rubella 08/01/2012   History of miscarriage 07/30/2012    Past Surgical History:  Procedure Laterality Date   NO PAST SURGERIES       OB History     Gravida  7   Para  3   Term  3   Preterm      AB  4   Living  3      SAB  3   IAB      Ectopic  1   Multiple  0   Live Births  3           Family History  Problem Relation Age of Onset   Hypertension Mother    Hypertension Father    Cancer Maternal Grandmother    Diabetes Maternal Grandmother    Cancer Paternal Grandmother    Diabetes Paternal Grandmother     Social History   Tobacco Use   Smoking status: Former   Smokeless tobacco: Never  Scientific laboratory technician Use: Never used  Substance Use Topics   Alcohol use: Not Currently    Comment: socially   Drug use: No    Home Medications Prior to Admission medications   Medication Sig Start Date End Date Taking? Authorizing Provider  busPIRone (BUSPAR) 10 MG tablet Take 10 mg by mouth 2 (two) times daily. 07/06/21  Yes [provider]  ibuprofen (  ADVIL) 800 MG tablet Take 1 tablet (800 mg total) by mouth every 8 (eight) hours as needed. 09/01/21  Yes Shelly Bombard, MD  LO LOESTRIN FE 1 MG-10 MCG / 10 MCG tablet Take 1 tablet by mouth daily. 09/01/21  Yes Shelly Bombard, MD  metroNIDAZOLE (FLAGYL) 500 MG tablet Take 1 tablet (500 mg total) by mouth 2 (two) times daily. 09/03/21  Yes Shelly Bombard, MD  Prenat-Fe Poly-Methfol-FA-DHA (VITAFOL ULTRA) 29-0.6-0.4-200 MG CAPS Take 1 capsule by mouth daily before breakfast. 09/01/21  Yes Shelly Bombard, MD  VYVANSE 30 MG capsule Take 30 mg by mouth daily. 06/22/21  Yes [provider]    Allergies    Patient has no known allergies.  Review of Systems   Review of Systems  Constitutional:  Negative for activity change, appetite change, chills, diaphoresis, fatigue and fever.  HENT:  Negative for congestion, ear pain  and sore throat.   Eyes:  Negative for pain and visual disturbance.  Respiratory:  Negative for cough, chest tightness, shortness of breath and wheezing.   Cardiovascular:  Positive for chest pain. Negative for palpitations, orthopnea and near-syncope.  Gastrointestinal:  Negative for abdominal pain, anorexia, diarrhea, nausea and vomiting.  Endocrine: Negative for polyuria.  Genitourinary:  Negative for dysuria, flank pain and hematuria.  Musculoskeletal:  Negative for arthralgias, back pain, myalgias and neck pain.  Skin:  Negative for color change and rash.  Neurological:  Negative for dizziness, seizures, syncope, weakness, light-headedness, numbness and headaches.  Hematological:  Does not bruise/bleed easily.  Psychiatric/Behavioral:  Negative for confusion and decreased concentration.   All other systems reviewed and are negative.  Physical Exam Updated Vital Signs BP (!) 131/91 (BP Location: Right Arm)    Pulse 63    Temp 99.3 F (37.4 C) (Oral)    Resp 15    Ht 5\' 2"  (1.575 m)    Wt 63.5 kg    LMP 08/25/2021 (Exact Date)    SpO2 99%    BMI 25.61 kg/m   Physical Exam Vitals and nursing note reviewed.  Constitutional:      General: She is not in acute distress.    Appearance: She is well-developed and normal weight. She is not ill-appearing, toxic-appearing or diaphoretic.  HENT:     Head: Normocephalic and atraumatic.  Eyes:     Extraocular Movements: Extraocular movements intact.     Conjunctiva/sclera: Conjunctivae normal.  Neck:     Vascular: No JVD.  Cardiovascular:     Rate and Rhythm: Normal rate and regular rhythm.     Heart sounds: No murmur heard. Pulmonary:     Effort: Pulmonary effort is normal. No respiratory distress.     Breath sounds: Normal breath sounds. No decreased breath sounds, wheezing, rhonchi or rales.  Abdominal:     Palpations: Abdomen is soft.     Tenderness: There is no abdominal tenderness.  Musculoskeletal:        General: No swelling.      Cervical back: Normal range of motion and neck supple.     Right lower leg: No edema.     Left lower leg: No edema.  Skin:    General: Skin is warm and dry.     Capillary Refill: Capillary refill takes less than 2 seconds.  Neurological:     General: No focal deficit present.     Mental Status: She is alert and oriented to person, place, and time.     Cranial Nerves: No  cranial nerve deficit.     Motor: No weakness.  Psychiatric:        Mood and Affect: Mood normal.        Behavior: Behavior normal.    ED Results / Procedures / Treatments   Labs (all labs ordered are listed, but only abnormal results are displayed) Labs Reviewed  BASIC METABOLIC PANEL - Abnormal; Notable for the following components:      Result Value   Calcium 8.5 (*)    All other components within normal limits  CBC  PREGNANCY, URINE  TROPONIN I (HIGH SENSITIVITY)  TROPONIN I (HIGH SENSITIVITY)    EKG EKG Interpretation  Date/Time:  Tuesday September 07 2021 11:51:01 EST Ventricular Rate:  57 PR Interval:  124 QRS Duration: 94 QT Interval:  407 QTC Calculation: 397 R Axis:   60 Text Interpretation: Sinus rhythm Probable anterior infarct, old Confirmed by Gloris Manchester [phone]) on 09/07/2021 11:57:10 AM  Radiology DG Chest 2 View  Result Date: 09/07/2021 CLINICAL DATA:  Chest pain since last night EXAM: CHEST - 2 VIEW COMPARISON:  None FINDINGS: Normal heart size, mediastinal contours, and pulmonary vascularity. Lungs clear. No pleural effusion or pneumothorax. Bones unremarkable. IMPRESSION: Normal exam Electronically Signed   By: Ulyses Southward M.D.   On: 09/07/2021 10:32   CT Angio Chest PE W and/or Wo Contrast  Result Date: 09/07/2021 CLINICAL DATA:  Provided history: Pulmonary embolism (PE) suspected, high probability. EXAM: CT CHEST WITH CONTRAST TECHNIQUE: Multidetector CT imaging of the chest was performed during intravenous contrast administration. CONTRAST:  36mL OMNIPAQUE IOHEXOL 350 MG/ML  SOLN COMPARISON:  Chest radiographs performed earlier today 09/07/2021. FINDINGS: CARDIOVASCULAR: Heart size within normal limits.No pericardial effusion. No significant vascular finding. Satisfactory opacification of the pulmonary arteries to the segmental level.No evidence of pulmonary embolus. MEDIASTINUM/NODES: No mediastinal, hilar or axillary lymphadenopathy. Visualized thyroid gland unremarkable. LUNGS/PLEURA: No airspace consolidation. No pleural effusion or pneumothorax. Central airways grossly patent. UPPER ABDOMEN: Suspected small hiatal hernia. Otherwise, no acute or significant finding. MUSCULOSKELETAL: No acute fracture or aggressive osseous lesion. The chest wall is unremarkable. IMPRESSION: No evidence of pulmonary embolism. Probable small hiatal hernia. Otherwise unremarkable examination. Electronically Signed   By: Jackey Loge D.O.   On: 09/07/2021 13:28    Procedures Procedures   Medications Ordered in ED Medications  lactated ringers bolus 500 mL (500 mLs Intravenous New Bag/Given 09/07/21 1305)  ketorolac (TORADOL) 15 MG/ML injection 15 mg (15 mg Intravenous Given 09/07/21 1306)  iohexol (OMNIPAQUE) 350 MG/ML injection 100 mL (80 mLs Intravenous Contrast Given 09/07/21 1254)    ED Course  I have reviewed the triage vital signs and the nursing notes.  Pertinent labs & imaging results that were available during my care of the patient were reviewed by me and considered in my medical decision making (see chart for details).    MDM Rules/Calculators/A&P HEAR Score: 0                       Patient presents with right-sided chest pain.  Onset was this morning and symptoms have improved since that time.  She denies any associated symptoms.  EKG shows no evidence of ischemia.  Based on symptoms, risk factors, and EKG findings, patient has a heart score of 0.  Laboratory work-up was initiated prior to being bedded in the ED.  Lab work, and troponins were normal.  Given pleuritic  nature of chest pain, CTA of chest was obtained.  CTA of chest showed  no cardiopulmonary abnormalities.  Patient does have a small hiatal hernia.  Toradol was given for analgesia.  Patient was given reassurance and discharged in good condition.  Final Clinical Impression(s) / ED Diagnoses Final diagnoses:  Chest pain, unspecified type    Rx / DC Orders ED Discharge Orders     None        Bean Pick, MD 09/08/21 2124

## 2021-09-07 NOTE — ED Triage Notes (Signed)
Pt has rt sided chest pain since 0430 this morning, sharp pain without any other symptoms. Worse when she goes from lying down to getting up.

## 2021-10-21 ENCOUNTER — Encounter: Payer: Medicaid Other | Admitting: Obstetrics

## 2021-10-25 NOTE — Progress Notes (Deleted)
[facility] New Patient Evaluation and Consultation  Referring Provider: Brock Bad, MD PCP: Oneita Hurt, No Date of Service: 10/26/2021  SUBJECTIVE Chief Complaint: No chief complaint on file.  History of Present Illness: Kelli Bean is a 36 y.o. Black or African-American female seen in consultation at the request of Dr. Clearance Coots for evaluation of incontinence.    Review of records from Dr Clearance Coots significant for: Has symptoms of stress incontinence  Urinary Symptoms: {urine leakage?:24754} Leaks *** time(s) per {days/wks/mos/yrs:310907}.  Pad use: {NUMBERS 1-10:18281} {pad option:24752} per day.   She {ACTION; IS/IS ZOX:09604540} bothered by her UI symptoms.  Day time voids ***.  Nocturia: *** times per night to void. Voiding dysfunction: she {empties:24755} her bladder well.  {DOES NOT does:27190::"does not"} use a catheter to empty bladder.  When urinating, she feels {urine symptoms:24756} Drinks: *** per day  UTIs: {NUMBERS 1-10:18281} UTI's in the last year.   {ACTIONS;DENIES/REPORTS:21021675::"Denies"} history of {urologic concerns:24757}  Pelvic Organ Prolapse Symptoms:                  She {denies/ admits to:24761} a feeling of a bulge the vaginal area. It has been present for {NUMBER 1-10:22536} {days/wks/mos/yrs:310907}.  She {denies/ admits to:24761} seeing a bulge.  This bulge {ACTION; IS/IS JWJ:19147829} bothersome.  Bowel Symptom: Bowel movements: *** time(s) per {Time; day/week/month:13537} Stool consistency: {stool consistency:24758} Straining: {yes/no:19897}.  Splinting: {yes/no:19897}.  Incomplete evacuation: {yes/no:19897}.  She {denies/ admits to:24761} accidental bowel leakage / fecal incontinence  Occurs: *** time(s) per {Time; day/week/month:13537}  Consistency with leakage: {stool consistency:24758} Bowel regimen: {bowel regimen:24759} Last colonoscopy: Date ***, Results ***  Sexual Function Sexually active: {yes/no:19897}.   Sexual orientation: {Sexual Orientation:[phone]} Pain with sex: {pain with sex:24762}  Pelvic Pain {denies/ admits to:24761} pelvic pain Location: *** Pain occurs: *** Prior pain treatment: *** Improved by: *** Worsened by: ***   Past Medical History:  Past Medical History:  Diagnosis Date   Anxiety    Depression    2008 on medication   GERD (gastroesophageal reflux disease)      Past Surgical History:   Past Surgical History:  Procedure Laterality Date   NO PAST SURGERIES       Past OB/GYN History: G{NUMBERS 1-10:18281} P{NUMBERS 1-10:18281} Vaginal deliveries: ***,  Forceps/ Vacuum deliveries: ***, Cesarean section: *** Menopausal: {menopausal:24763} Contraception: ***. Last pap smear was ***.  Any history of abnormal pap smears: {yes/no:19897}.   Medications: She has a current medication list which includes the following prescription(s): buspirone, ibuprofen, lo loestrin fe, metronidazole, vitafol ultra, and vyvanse.   Allergies: Patient has No Known Allergies.   Social History:  Social History   Tobacco Use   Smoking status: Former   Smokeless tobacco: Never  Building services engineer Use: Never used  Substance Use Topics   Alcohol use: Not Currently    Comment: socially   Drug use: No    Relationship status: {relationship status:24764} She lives with ***.   She {ACTION; IS/IS FAO:13086578} employed ***. Regular exercise: {Yes/No:304960894} History of abuse: {Yes/No:304960894}  Family History:   Family History  Problem Relation Age of Onset   Hypertension Mother    Hypertension Father    Cancer Maternal Grandmother    Diabetes Maternal Grandmother    Cancer Paternal Grandmother    Diabetes Paternal Grandmother      Review of Systems: ROS   OBJECTIVE Physical Exam: There were no vitals filed for this visit.  Physical Exam   GU / Detailed Urogynecologic Evaluation:  Pelvic Exam: Normal external female genitalia; Bartholin's and  Skene's glands normal in appearance; urethral meatus normal in appearance, no urethral masses or discharge.   CST: {gen negative/positive:315881}  Reflexes: bulbocavernosis {DESC; PRESENT/NOT PRESENT:21021351}, anocutaneous {DESC; PRESENT/NOT PRESENT:21021351} ***bilaterally.  Speculum exam reveals normal vaginal mucosa {With/Without:20273} atrophy. Cervix {exam; gyn cervix:30847}. Uterus {exam; pelvic uterus:30849}. Adnexa {exam; adnexa:12223}.    s/p hysterectomy: Speculum exam reveals normal vaginal mucosa {With/Without:20273}  atrophy and normal vaginal cuff.  Adnexa {exam; adnexa:12223}.    With apex supported, anterior compartment defect was {reduced:24765}  Pelvic floor strength {Roman # I-V:19040}/V, puborectalis {Roman # I-V:19040}/V external anal sphincter {Roman # I-V:19040}/V  Pelvic floor musculature: Right levator {Tender/Non-tender:20250}, Right obturator {Tender/Non-tender:20250}, Left levator {Tender/Non-tender:20250}, Left obturator {Tender/Non-tender:20250}  POP-Q:   POP-Q                                               Aa                                               Ba                                                 C                                                Gh                                               Pb                                               tvl                                                Ap                                               Bp                                                 D     Rectal Exam:  Normal sphincter tone, {rectocele:24766} distal rectocele, enterocoele {DESC; PRESENT/NOT PRESENT:21021351}, no rectal masses, {sign of:24767} dyssynergia when asking the patient to bear down.  Post-Void Residual (PVR) by Bladder Scan: In order to  evaluate bladder emptying, we discussed obtaining a postvoid residual and she agreed to this procedure.  Procedure: The ultrasound unit was placed on the patient's abdomen in the  suprapubic region after the patient had voided. A PVR of *** ml was obtained by bladder scan.  Laboratory Results: @ENCLABS @   ***I visualized the urine specimen, noting the specimen to be {urine color:24768}  ASSESSMENT AND PLAN Kelli Bean is a 36 y.o. with: No diagnosis found.    Jaquita Folds, MD   Medical Decision Making:  - Reviewed/ ordered a clinical laboratory test - Reviewed/ ordered a radiologic study - Reviewed/ ordered medicine test - Decision to obtain old records - Discussion of management of or test interpretation with an external physician / other healthcare professional  - Assessment requiring independent historian - Review and summation of prior records - Independent review of image, tracing or specimen

## 2021-10-26 ENCOUNTER — Ambulatory Visit: Payer: Medicaid Other | Admitting: Obstetrics and Gynecology

## 2021-12-02 ENCOUNTER — Encounter: Payer: Self-pay | Admitting: Obstetrics

## 2021-12-02 ENCOUNTER — Other Ambulatory Visit: Payer: Self-pay

## 2021-12-02 ENCOUNTER — Other Ambulatory Visit (HOSPITAL_COMMUNITY)
Admission: RE | Admit: 2021-12-02 | Discharge: 2021-12-02 | Disposition: A | Discharge status: Home / Self Care | Payer: Medicaid Other | Source: Ambulatory Visit | Attending: Obstetrics | Admitting: Obstetrics

## 2021-12-02 ENCOUNTER — Ambulatory Visit (INDEPENDENT_AMBULATORY_CARE_PROVIDER_SITE_OTHER): Payer: Medicaid Other | Admitting: Obstetrics

## 2021-12-02 VITALS — BP 115/74 | HR 71 | Ht 63.0 in | Wt 138.7 lb

## 2021-12-02 DIAGNOSIS — Z3009 Encounter for other general counseling and advice on contraception: Secondary | ICD-10-CM | POA: Diagnosis not present

## 2021-12-02 DIAGNOSIS — R87613 High grade squamous intraepithelial lesion on cytologic smear of cervix (HGSIL): Secondary | ICD-10-CM | POA: Insufficient documentation

## 2021-12-02 DIAGNOSIS — Z30011 Encounter for initial prescription of contraceptive pills: Secondary | ICD-10-CM | POA: Diagnosis not present

## 2021-12-02 LAB — POCT URINE PREGNANCY: Preg Test, Ur: NEGATIVE

## 2021-12-02 MED ORDER — NORGESTIMATE-ETH ESTRADIOL 0.25-35 MG-MCG PO TABS
1.0000 | ORAL_TABLET | Freq: Every day | ORAL | 11 refills | Status: DC
Start: 2021-12-02 — End: 2023-10-24

## 2021-12-02 NOTE — Progress Notes (Signed)
Colposcopy Procedure Note ? ?Indications: Pap smear 3 months ago showed: high-grade squamous intraepithelial neoplasia  (HGSIL-encompassing moderate and severe dysplasia). The prior pap showed no abnormalities.  Prior cervical/vaginal disease: normal exam without visible pathology. Prior cervical treatment: no treatment. ? ?Procedure Details  ?The risks and benefits of the procedure and Written informed consent obtained.  A time-out was performed confirming the patient, procedure and allergy status ? ?Speculum placed in vagina and excellent visualization of cervix achieved, cervix swabbed x 3 with acetic acid solution. ? ?Findings: ?Cervix: no mosaicism, no punctation, no abnormal vasculature, and acetowhite lesion(s) noted at 3, 6, and 12 o'clock; SCJ visualized 360 degrees without lesions, endocervical curettage performed, cervical biopsies taken at 3, 6, and 12 o'clock, specimen labelled and sent to pathology, and hemostasis achieved with silver nitrate.   ?Vaginal inspection: normal without visible lesions. ?Vulvar colposcopy: vulvar colposcopy not performed. ?  ?Physical Exam ?  ?Specimens: ECC and Cervical Biopsies ? ?Complications: none. ? ?Plan: ?Specimens labelled and sent to Pathology. ?Will base further treatment on Pathology findings. ?Treatment options discussed with patient. ?Post biopsy instructions given to patient. ?Return to discuss Pathology results in 2 weeks.  ? ? ?Brock Bad, MD ?12/02/2021 9:19 AM  ?

## 2021-12-02 NOTE — Progress Notes (Signed)
Colposcopy ?Also wants to change OCP's, stopped Lo Loestrin ?Missed appt urogyn, would like to reschedule ?Concerned with integrity of perineal muscles ?

## 2021-12-06 LAB — SURGICAL PATHOLOGY

## 2021-12-07 ENCOUNTER — Telehealth: Payer: Self-pay

## 2021-12-07 NOTE — Telephone Encounter (Signed)
Attempted to contact about results and need for LEEP, no answer, left vm ?

## 2021-12-08 ENCOUNTER — Telehealth: Payer: Self-pay

## 2021-12-08 NOTE — Telephone Encounter (Signed)
S/w pt and advised of need for LEEP procedure with Dr. Alysia Penna. Pt has f/u appt on 3/30 to discuss results. Transferred to scheduler ?

## 2021-12-16 ENCOUNTER — Telehealth (INDEPENDENT_AMBULATORY_CARE_PROVIDER_SITE_OTHER): Payer: Medicaid Other | Admitting: Obstetrics

## 2021-12-16 DIAGNOSIS — D069 Carcinoma in situ of cervix, unspecified: Secondary | ICD-10-CM | POA: Diagnosis not present

## 2021-12-16 NOTE — Progress Notes (Signed)
? ? ?GYNECOLOGY VIRTUAL VISIT ENCOUNTER NOTE ? ?Provider location: [facility] [place]  ? ?Patient location: Home ? ?I connected with Army Chaco on 12/16/21 at  8:55 AM EDT by MyChart Video Encounter and verified that I am speaking with the correct person using two identifiers. ?  ?I discussed the limitations, risks, security and privacy concerns of performing an evaluation and management service virtually and the availability of in person appointments. I also discussed with the patient that there may be a patient responsible charge related to this service. The patient expressed understanding and agreed to proceed. ?  ?History:  ?Kelli Bean is a 36 y.o. [phone] female being evaluated today for results of colposcopy for HGSIL pap smear. She denies any abnormal vaginal discharge, bleeding, pelvic pain or other concerns.   ?  ?  ?Past Medical History:  ?Diagnosis Date  ? Anxiety   ? Depression   ? 2008 on medication  ? GERD (gastroesophageal reflux disease)   ? ?Past Surgical History:  ?Procedure Laterality Date  ? NO PAST SURGERIES    ? ?The following portions of the patient's history were reviewed and updated as appropriate: allergies, current medications, past family history, past medical history, past social history, past surgical history and problem list.  ? ?Health Maintenance:  Abnormal pap and positive HRHPV on 09-01-2021.   ? ?Review of Systems:  ?Pertinent items noted in HPI and remainder of comprehensive ROS otherwise negative. ? ?Physical Exam:  ? ?General:  Alert, oriented and cooperative. Patient appears to be in no acute distress.  ?Mental Status: Normal mood and affect. Normal behavior. Normal judgment and thought content.   ?Respiratory: Normal respiratory effort, no problems with respiration noted  ?Rest of physical exam deferred due to type of encounter ? ?Labs and Imaging ? ?   ?Results ?Surgical pathology (Order [id number]) ?MyChart Results Release ? ?MyChart  Status: Active  Results Release  ? ?Surgical pathology ?Order: [id number] ?Status: Edited Result - FINAL    ?Visible to patient: Yes (not seen)    ?Next appt: 01/27/2022 at 01:10 PM in Obstetrics and Gynecology Hermina Staggers, MD)    ?Dx: HGSIL (high grade squamous intraepith...    ?3 Result Notes ?   ?Component 2 wk ago  ?SURGICAL PATHOLOGY SURGICAL PATHOLOGY  ?CASE: [id number]  ?PATIENT: Kelli Bean  ?Surgical Pathology Report  ? ? ? ? ?Clinical History: HGSIL (cm)  ? ? ? ? ?FINAL MICROSCOPIC DIAGNOSIS:  ? ?A. ENDOCERVIX, CURETTAGE:  ?- Benign cervical glandular mucosa  ?- Negative for dysplasia or malignancy  ? ?B. 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 ? ? ? ? ?COMMENT:  ? ?B.  Immunostain for p16 shows diffuse, block-like positive staining,  ?consistent with above interpretation.  ? ? ? ? ? ? ?GROSS DESCRIPTION:  ? ?Specimen A: Received in formalin is blood tinged mucus that is entirely  ?submitted in one block.  Volume: 1.3 x 1.1 x 0.2 cm.  ? ?Specimen B: Received in formalin are pink-white soft tissue fragments  ?that are submitted in toto. Number: 3.  Size: 0.15 to 0.4 cm.  Blocks: 1  ? ?SW 12/02/2021  ? ? ?Final Diagnosis performed by Holley Bouche, MD.   Electronically  ?signed 12/06/2021  ?Technical component performed at [employer], [address] [place], [place] [address].  ? Professional component performed at [facility],  ?  [address], [place], [place] [address].  ? Immunohistochemistry Technical component (if applicable) was performed  ?at [facility] [address],  ?[place], [place] [address].   IMMUNOHISTOCHEMISTRY DISCLAIMER (if applicable):  ?Some of these immunohistochemical stains may have been developed and the  ?performance characteristics determine by [facility] Some  ?may not have been cleared or  approved by the U.S. Food and Drug  ?Administration. The FDA has determined that such clearance or approval  ?is not necessary. This test is used for clinical purposes. It should not  ?be regarded as investigational or for research. This laboratory is  ?certified under the Clinical Laboratory Improvement Amendments of 1988  ?(CLIA-88) as qualified to perform high complexity clinical laboratory  ?testing.  The controls stained appropriately.   ?Resulting Agency [facility]  ?  ? ? ? ?Assessment and Plan:  ?   ?1. High grade squamous intraepithelial lesion (HGSIL), grade 3 CIN, on biopsy of cervix ?- discussed results and LEEP recommended   ?   ?  ?I discussed the assessment and treatment plan with the patient. The patient was provided an opportunity to ask questions and all were answered. The patient agreed with the plan and demonstrated an understanding of the instructions. ?  ?The patient was advised to call back or seek an in-person evaluation/go to the ED if the symptoms worsen or if the condition fails to improve as anticipated. ? ?I have spent a total of 10 minutes of face-to-face and time, excluding clinical staff time, reviewing notes and preparing to see patient, ordering tests and/or medications, and counseling the patient.  ? ? ?Coral Ceo, MD ?Center for [facility], [facility], [facility] ?12/16/21  ?

## 2022-01-27 ENCOUNTER — Ambulatory Visit (INDEPENDENT_AMBULATORY_CARE_PROVIDER_SITE_OTHER): Payer: Medicaid Other | Admitting: Obstetrics and Gynecology

## 2022-01-27 ENCOUNTER — Encounter: Payer: Self-pay | Admitting: Obstetrics and Gynecology

## 2022-01-27 ENCOUNTER — Other Ambulatory Visit (HOSPITAL_COMMUNITY)
Admission: RE | Admit: 2022-01-27 | Discharge: 2022-01-27 | Disposition: A | Discharge status: Home / Self Care | Payer: Medicaid Other | Source: Ambulatory Visit | Attending: Obstetrics and Gynecology | Admitting: Obstetrics and Gynecology

## 2022-01-27 DIAGNOSIS — R87613 High grade squamous intraepithelial lesion on cytologic smear of cervix (HGSIL): Secondary | ICD-10-CM | POA: Insufficient documentation

## 2022-01-27 NOTE — Progress Notes (Signed)
? ?  GYNECOLOGY CLINIC PROCEDURE NOTE ? ?Kelli Bean is a 36 y.o. [phone] here for LEEP. No GYN concerns. Pap smear and colposcopy reviewed.   ? ?Pap HGSIL ?Colpo Biopsy HGSIL ?ECC negative ? ?Risks, benefits, alternatives, and limitations of procedure explained to patient, including pain, bleeding, infection, failure to remove abnormal tissue and failure to cure dysplasia, need for repeat procedures, damage to pelvic organs, cervical incompetence.  Role of HPV,cervical dysplasia and need for close followup was empasized. Informed written consent was obtained. All questions were answered. Time out performed.  ???Procedure: The patient was placed in lithotomy position and the bivalved coated speculum was placed in the patient's vagina. A grounding pad placed on the patient. Lugol's solution was applied to the cervix and areas of decreased uptake were noted around the transformation zone.   Local anesthesia was administered via an intracervical block using 10cc of 2% Lidocaine with epinephrine. The suction was turned on and the Large 1X Fisher Cone Biopsy Excisor on 22 Watts of cutting current was used to excise the area of decreased uptake and excise the entire transformation zone. Excellent hemostasis was achieved using roller ball coagulation set at 50 Watts coagulation current. Monsel's solution was then applied and the speculum was removed from the vagina. Specimens were sent to pathology. ? ??The patient tolerated the procedure well. Post-operative instructions given to patient, including instruction to seek medical attention for persistent bright red bleeding, fever, abdominal/pelvic pain, dysuria, nausea or vomiting. She was also told about the possibility of having copious yellow to black tinged discharge for weeks. She was counseled to avoid anything in the vagina (sex/douching/tampons) for 4 weeks. She has a 4 week post-operative check to assess wound healing, review results and discuss further  management.  ? ? ?Arlina Robes, MD, FACOG ?Attending [place] for [employer], [place]  ?

## 2022-01-27 NOTE — Patient Instructions (Signed)
LEEP POST-PROCEDURE INSTRUCTIONS  You may take Ibuprofen, Aleve or Tylenol for pain if needed.  Cramping is normal.  You will have black and/or bloody discharge at first.  This will lighten and then turn clear before completely resolving.  This will take 2 to 3 weeks.  Put nothing in your vagina until the bleeding or discharge stops (usually 2 or3 days).  You need to call if you have redness around the biopsy site, if there is any unusual draining, if the bleeding is heavy, or if you are concerned.  Shower or bathe as normal  We will call you within one week with results or we will discuss the results at your follow-up appointment if needed.  You will need to return for a follow-up Pap smear as directed by your physician.  Loop Electrosurgical Excision Procedure Loop electrosurgical excision procedure (LEEP) is the cutting and removal (excision) of tissue from the cervix. The cervix is the bottom part of the uterus that opens into the vagina. The tissue that is removed from the cervix is examined to see if there are cancer cells or cells that might turn into cancer (precancerous cells). LEEP may be done when: You have abnormal bleeding from your cervix. You have an abnormal Pap test result. Your health care provider finds abnormalities on your cervix during an exam. LEEP typically only takes a few minutes and is often done in the health care provider's office. The procedure is safe for women who are trying to get pregnant. The procedure is usually not done during a menstrual period or during pregnancy. Tell a health care provider about: Any allergies you have. All medicines you are taking, including vitamins, herbs, eye drops, creams, and over-the-counter medicines. Any problems you or family members have had with anesthetic medicines. Any bleeding problems you have. Any medical conditions you have or have had. This includes current or past vaginal infections, such as herpes or STIs  (sexually transmitted infections). Whether you are pregnant or may be pregnant. If you are having vaginal bleeding on the day of the procedure. What are the risks? Generally, this is a safe procedure. However, problems may occur, including: Infection. Bleeding. Allergic reactions to medicines. Changes or scarring in the cervix. Damage to nearby structures or organs. Increased risk of early (preterm) labor in future pregnancies. What happens before the procedure? Ask your health care provider about: Changing or stopping your regular medicines. This is especially important if you are taking diabetes medicines or blood thinners. Taking medicines such as aspirin and ibuprofen. These medicines can thin your blood. Do not take these medicines unless your health care provider tells you to take them. Taking over-the-counter medicines, vitamins, herbs, and supplements. Your health care provider may recommend that you take pain medicine before the procedure. Ask your health care provider if you should plan to have a responsible adult take you home after the procedure. What happens during the procedure?  An instrument called a speculum will be placed in your vagina. This will allow your health care provider to see your cervix. You will be given a medicine to numb the area (local anesthetic). The medicine will be injected into your cervix and the surrounding area. A solution will be applied to your cervix. This solution will help the health care provider find the abnormal cells that need to be removed. A thin wire loop will be passed through your vagina to your cervix. The wire will remove layers of abnormal cervical cells. The wire will burn (cauterize)   the cervical tissue with an electrical current during cell removal. Open blood vessels will be cauterized to prevent bleeding. You might feel some pressure, aching, and cramping. If you feel like you will faint during the procedure, tell your health  care provider right away. A paste may be applied to the cauterized area of your cervix to help control bleeding. The sample of cervical tissue will be sent to a lab and looked at under a microscope. The procedure may vary among health care providers and hospitals. What can I expect after the procedure? After the procedure, it is common to have: Mild abdominal cramps that may last for up to 1 week. A small amount of pink-tinged or bloody vaginal discharge, including light to moderate bleeding, for 1-2 weeks. A brown- or black-colored discharge coming from your vagina, if a paste was used on the cervix to control bleeding. It is up to you to get the results of your procedure. Ask your health care provider, or the department that is doing the procedure, when your results will be ready. Follow these instructions at home: Take over-the-counter and prescription medicines only as told by your health care provider. Return to your normal activities as told by your health care provider. Ask your health care provider what activities are safe for you. Do not put anything in your vagina for 2 weeks after the procedure or until your health care provider says that it is okay. This includes tampons, creams, and douches. Do not have sex until your health care provider approves. Keep all follow-up visits. This is important. Contact a health care provider if: You have a fever or chills. You feel very weak. You have blood clots or bleeding that is heavier than a normal menstrual period. Bleeding that soaks a pad in less than 1 hour is considered heavy bleeding. You develop a bad-smelling discharge from your vagina. You have severe abdominal pain or cramping. Summary Loop electrosurgical excision procedure (LEEP) is the removal of tissue from the cervix. The removed tissue will be checked for precancerous cells or cancer cells. LEEP typically only takes a few minutes and is often done in your health care  provider's office. Do not put anything in your vagina for 2 weeks after the procedure or until your health care provider says that it is okay. This includes tampons, creams, and douches. Ask your health care provider, or the department that is doing the procedure, when your results will be ready. This information is not intended to replace advice given to you by your health care provider. Make sure you discuss any questions you have with your health care provider. Document Revised: 02/10/2021 Document Reviewed: 02/10/2021 Elsevier Patient Education  2023 Elsevier Inc.  

## 2022-01-31 LAB — SURGICAL PATHOLOGY

## 2022-02-24 ENCOUNTER — Ambulatory Visit (INDEPENDENT_AMBULATORY_CARE_PROVIDER_SITE_OTHER): Payer: Medicaid Other | Admitting: Obstetrics and Gynecology

## 2022-02-24 ENCOUNTER — Encounter: Payer: Self-pay | Admitting: Obstetrics and Gynecology

## 2022-02-24 VITALS — BP 121/84 | HR 93 | Ht 63.0 in | Wt 128.0 lb

## 2022-02-24 DIAGNOSIS — R87613 High grade squamous intraepithelial lesion on cytologic smear of cervix (HGSIL): Secondary | ICD-10-CM

## 2022-02-24 DIAGNOSIS — Z9889 Other specified postprocedural states: Secondary | ICD-10-CM

## 2022-02-24 NOTE — Progress Notes (Signed)
36 y.o GYN presents for Post Procedure FU.  Reports no complaints Today.

## 2022-02-24 NOTE — Patient Instructions (Signed)

## 2022-02-24 NOTE — Progress Notes (Signed)
Kelli Bean is here for post LEEP f/u. No complaints Denies any bowel or bladder dysfunction Has had a cycle since procedure  Pathology reviewed with pt  PE AF VSS Lungs clear Heart RRR Abd soft + BS GU NL EGBUS, cervix well healed, some spotting noted  A/P S/P LEEP  Return to ADL's as tolerates. F/U in 1 yr for pap smear

## 2022-04-04 NOTE — Progress Notes (Signed)
[facility] New Patient Evaluation and Consultation  Referring Provider: Brock Bad, MD PCP: Kelli Bean, No Date of Service: 04/05/2022  SUBJECTIVE Chief Complaint: No chief complaint on file.  History of Present Illness: Kelli Bean is a 36 y.o. Black or African-American female seen in consultation at the request of Kelli Bean for evaluation of incontinence.    Review of records from Dr Kelli Bean significant for: Has leakage of urine with cough, sneeze, laugh and other valsalva.   Urinary Symptoms: {urine leakage?:24754} Leaks *** time(s) per {days/wks/mos/yrs:310907}.  Pad use: {NUMBERS 1-10:18281} {pad option:24752} per day.   She {ACTION; IS/IS FMB:[id number]} bothered by her UI symptoms.  Day time voids ***.  Nocturia: *** times per night to void. Voiding dysfunction: she {empties:24755} her bladder well.  {DOES NOT does:27190::"does not"} use a catheter to empty bladder.  When urinating, she feels {urine symptoms:24756} Drinks: *** per day  UTIs: {NUMBERS 1-10:18281} UTI's in the last year.   {ACTIONS;DENIES/REPORTS:21021675::"Denies"} history of {urologic concerns:24757}  Pelvic Organ Prolapse Symptoms:                  She {denies/ admits to:24761} a feeling of a bulge the vaginal area. It has been present for {NUMBER 1-10:22536} {days/wks/mos/yrs:310907}.  She {denies/ admits to:24761} seeing a bulge.  This bulge {ACTION; IS/IS TTS:17793903} bothersome.  Bowel Symptom: Bowel movements: *** time(s) per {Time; day/week/month:13537} Stool consistency: {stool consistency:24758} Straining: {yes/no:19897}.  Splinting: {yes/no:19897}.  Incomplete evacuation: {yes/no:19897}.  She {denies/ admits to:24761} accidental bowel leakage / fecal incontinence  Occurs: *** time(s) per {Time; day/week/month:13537}  Consistency with leakage: {stool consistency:24758} Bowel regimen: {bowel regimen:24759} Last colonoscopy: Date ***, Results ***  Sexual  Function Sexually active: {yes/no:19897}.  Sexual orientation: {Sexual Orientation:[phone]} Pain with sex: {pain with sex:24762}  Pelvic Pain {denies/ admits to:24761} pelvic pain Location: *** Pain occurs: *** Prior pain treatment: *** Improved by: *** Worsened by: ***   Past Medical History:  Past Medical History:  Diagnosis Date   Anxiety    Depression    2008 on medication   GERD (gastroesophageal reflux disease)      Past Surgical History:   Past Surgical History:  Procedure Laterality Date   NO PAST SURGERIES       Past OB/GYN History: G{NUMBERS 1-10:18281} P{NUMBERS 1-10:18281} Vaginal deliveries: ***,  Forceps/ Vacuum deliveries: ***, Cesarean section: *** Menopausal: {menopausal:24763} Contraception: ***. Last pap smear was ***.  Any history of abnormal pap smears: {yes/no:19897}.   Medications: She has a current medication list which includes the following prescription(s): buspirone, norgestimate-ethinyl estradiol, and vyvanse.   Allergies: Patient has No Known Allergies.   Social History:  Social History   Tobacco Use   Smoking status: Former   Smokeless tobacco: Never  Building services engineer Use: Never used  Substance Use Topics   Alcohol use: Not Currently    Comment: socially   Drug use: No    Relationship status: {relationship status:24764} She lives with ***.   She {ACTION; IS/IS ESP:23300762} employed ***. Regular exercise: {Yes/No:304960894} History of abuse: {Yes/No:304960894}  Family History:   Family History  Problem Relation Age of Onset   Hypertension Mother    Hypertension Father    Cancer Maternal Grandmother    Diabetes Maternal Grandmother    Cancer Paternal Grandmother    Diabetes Paternal Grandmother      Review of Systems: ROS   OBJECTIVE Physical Exam: There were no vitals filed for this visit.  Physical Exam   GU / Detailed Urogynecologic  Evaluation:  Pelvic Exam: Normal external female genitalia;  Bartholin's and Skene's glands normal in appearance; urethral meatus normal in appearance, no urethral masses or discharge.   CST: {gen negative/positive:315881}  Reflexes: bulbocavernosis {DESC; PRESENT/NOT PRESENT:21021351}, anocutaneous {DESC; PRESENT/NOT PRESENT:21021351} ***bilaterally.  Speculum exam reveals normal vaginal mucosa {With/Without:20273} atrophy. Cervix {exam; gyn cervix:30847}. Uterus {exam; pelvic uterus:30849}. Adnexa {exam; adnexa:12223}.    s/p hysterectomy: Speculum exam reveals normal vaginal mucosa {With/Without:20273}  atrophy and normal vaginal cuff.  Adnexa {exam; adnexa:12223}.    With apex supported, anterior compartment defect was {reduced:24765}  Pelvic floor strength {Roman # I-V:19040}/V, puborectalis {Roman # I-V:19040}/V external anal sphincter {Roman # I-V:19040}/V  Pelvic floor musculature: Right levator {Tender/Non-tender:20250}, Right obturator {Tender/Non-tender:20250}, Left levator {Tender/Non-tender:20250}, Left obturator {Tender/Non-tender:20250}  POP-Q:   POP-Q                                               Aa                                               Ba                                                 C                                                Gh                                               Pb                                               tvl                                                Ap                                               Bp                                                 D     Rectal Exam:  Normal sphincter tone, {rectocele:24766} distal rectocele, enterocoele {DESC; PRESENT/NOT PRESENT:21021351}, no rectal masses, {sign of:24767} dyssynergia when asking the patient to bear down.  Post-Void Residual (PVR) by Bladder Scan: In  order to evaluate bladder emptying, we discussed obtaining a postvoid residual and she agreed to this procedure.  Procedure: The ultrasound unit was placed on the patient's  abdomen in the suprapubic region after the patient had voided. A PVR of *** ml was obtained by bladder scan.  Laboratory Results: @ENCLABS @   ***I visualized the urine specimen, noting the specimen to be {urine color:24768}  ASSESSMENT AND PLAN Kelli Bean is a 36 y.o. with: No diagnosis found.    Jaquita Folds, MD   Medical Decision Making:  - Reviewed/ ordered a clinical laboratory test - Reviewed/ ordered a radiologic study - Reviewed/ ordered medicine test - Decision to obtain old records - Discussion of management of or test interpretation with an external physician / other healthcare professional  - Assessment requiring independent historian - Review and summation of prior records - Independent review of image, tracing or specimen

## 2022-04-05 ENCOUNTER — Ambulatory Visit (INDEPENDENT_AMBULATORY_CARE_PROVIDER_SITE_OTHER): Payer: Medicaid Other | Admitting: Obstetrics and Gynecology

## 2022-04-05 ENCOUNTER — Encounter: Payer: Self-pay | Admitting: Obstetrics and Gynecology

## 2022-04-05 VITALS — BP 136/93 | HR 64 | Ht 61.25 in | Wt 130.0 lb

## 2022-04-05 DIAGNOSIS — N8110 Cystocele, unspecified: Secondary | ICD-10-CM | POA: Diagnosis not present

## 2022-04-05 DIAGNOSIS — R350 Frequency of micturition: Secondary | ICD-10-CM

## 2022-04-05 DIAGNOSIS — N393 Stress incontinence (female) (male): Secondary | ICD-10-CM | POA: Diagnosis not present

## 2022-04-05 LAB — POCT URINALYSIS DIPSTICK
Appearance: NORMAL
Bilirubin, UA: NEGATIVE
Blood, UA: NEGATIVE
Glucose, UA: NEGATIVE
Ketones, UA: NEGATIVE
Leukocytes, UA: NEGATIVE
Nitrite, UA: NEGATIVE
Protein, UA: NEGATIVE
Spec Grav, UA: 1.01 (ref 1.010–1.025)
Urobilinogen, UA: 0.2 E.U./dL
pH, UA: 6 (ref 5.0–8.0)

## 2022-04-05 NOTE — Patient Instructions (Signed)
For treatment of stress urinary incontinence, which is leakage with physical activity/movement/strainging/coughing, we discussed expectant management versus nonsurgical options versus surgery. Nonsurgical options include weight loss, physical therapy, as well as a pessary.  Surgical options include a midurethral sling, which is a synthetic mesh sling that acts like a hammock under the urethra to prevent leakage of urine, a Burch urethropexy, and transurethral injection of a bulking agent.  You have a stage 1 (out of 4) prolapse.  We discussed the fact that it is not life threatening but there are several treatment options. For treatment of pelvic organ prolapse, we discussed options for management including expectant management, conservative management, and surgical management, such as Kegels, a pessary, pelvic floor physical therapy, and specific surgical procedures.    

## 2022-05-18 NOTE — Therapy (Deleted)
OUTPATIENT PHYSICAL THERAPY FEMALE PELVIC EVALUATION   Patient Name: Kelli Bean MRN: [id number] DOB:10/30/1985, 36 y.o., female Today's Date: 05/18/2022    Past Medical History:  Diagnosis Date   Anxiety    Depression    2008 on medication   GERD (gastroesophageal reflux disease)    Past Surgical History:  Procedure Laterality Date   LEEP     NO PAST SURGERIES     Patient Active Problem List   Diagnosis Date Noted   History of loop electrical excision procedure (LEEP) 02/24/2022   HGSIL (high grade squamous intraepithelial lesion) on Pap smear of cervix 01/27/2022   Anxiety disorder 05/15/2016   Depression 05/15/2016   History of ectopic pregnancy 05/23/2014   History of 2 spontaneous abortions 08/09/2013   Not immune to rubella 08/01/2012   History of miscarriage 07/30/2012    PCP: None  REFERRING PROVIDER: N39.3 (ICD-10-CM) - Stress incontinence  REFERRING DIAG: Marguerita BeardsSchroeder, Michelle N, MD  THERAPY DIAG:  No diagnosis found.  Rationale for Evaluation and Treatment Rehabilitation  ONSET DATE: ***  SUBJECTIVE:                                                                                                                                                                                           SUBJECTIVE STATEMENT: *** Fluid intake: {Yes/No:304960894}    PAIN:  Are you having pain? {yes/no:20286} NPRS scale: ***/10 Pain location: {pelvic pain location:27098}  Pain type: {type:313116} Pain description: {PAIN DESCRIPTION:21022940}   Aggravating factors: *** Relieving factors: ***  PRECAUTIONS: {Therapy precautions:24002}  WEIGHT BEARING RESTRICTIONS {Yes ***/No:24003}  FALLS:  Has patient fallen in last 6 months? {fallsyesno:27318}  LIVING ENVIRONMENT: Lives with: {OP[facility] lives with:25569::"lives with their family"} Lives in: {Lives in:25570} Stairs: {opstairs:27293} Has following equipment at home: {Assistive devices:23999}  OCCUPATION:  ***  PLOF: {PLOF:24004}  PATIENT GOALS ***  PERTINENT HISTORY:  *** Sexual abuse: {Yes/No:304960894}  BOWEL MOVEMENT Pain with bowel movement: {yes/no:20286} Type of bowel movement:{PT BM type:27100} Fully empty rectum: {Yes/No:304960894} Leakage: {Yes/No:304960894} Pads: {Yes/No:304960894} Fiber supplement: {Yes/No:304960894}  URINATION Pain with urination: {yes/no:20286} Fully empty bladder: {Yes/No:304960894} Stream: {PT urination:27102} Urgency: {Yes/No:304960894} Frequency: 4 times during the day and 3 times at night Leakage: Coughing, Sneezing, Laughing, and Exercise; jumping jacks; leakage can be alot Pads: Yes: 1-2 liners per day  INTERCOURSE; pain with sex, dryness Pain with intercourse: {pain with intercourse PA:27099} Ability to have vaginal penetration:  {Yes/No:304960894} Climax: *** Marinoff Scale: ***/3  PREGNANCY Vaginal deliveries *** Tearing {Yes***/No:304960894} C-section deliveries *** Currently pregnant {Yes***/No:304960894}  PROLAPSE {PT prolapse:27101}Stage I anterior, Stage I posterior, Stage I apical prolapse    OBJECTIVE:  DIAGNOSTIC FINDINGS:  Pelvic floor strength II/V; PVR of 42 ml was obtained by bladder scan.  PATIENT SURVEYS:  {rehab surveys:24030}  PFIQ-7 ***  COGNITION:  Overall cognitive status: {cognition:24006}     SENSATION:  Light touch: {intact/deficits:24005}  Proprioception: {intact/deficits:24005}  MUSCLE LENGTH: Hamstrings: Right *** deg; Left *** deg Thomas test: Right *** deg; Left *** deg  LUMBAR SPECIAL TESTS:  {lumbar special test:25242}  FUNCTIONAL TESTS:  {Functional tests:24029}  GAIT: Distance walked: *** Assistive device utilized: {Assistive devices:23999} Level of assistance: {Levels of assistance:24026} Comments: ***               POSTURE: {posture:25561}   PELVIC ALIGNMENT:  LUMBARAROM/PROM  A/PROM A/PROM  eval  Flexion   Extension   Right lateral flexion   Left lateral  flexion   Right rotation   Left rotation    (Blank rows = not tested)  LOWER EXTREMITY ROM:  {AROM/PROM:27142} ROM Right eval Left eval  Hip flexion    Hip extension    Hip abduction    Hip adduction    Hip internal rotation    Hip external rotation    Knee flexion    Knee extension    Ankle dorsiflexion    Ankle plantarflexion    Ankle inversion    Ankle eversion     (Blank rows = not tested)  LOWER EXTREMITY MMT:  MMT Right eval Left eval  Hip flexion    Hip extension    Hip abduction    Hip adduction    Hip internal rotation    Hip external rotation    Knee flexion    Knee extension    Ankle dorsiflexion    Ankle plantarflexion    Ankle inversion    Ankle eversion      PALPATION:   General  ***                External Perineal Exam ***                             Internal Pelvic Floor ***  Patient confirms identification and approves PT to assess internal pelvic floor and treatment {yes/no:20286}  PELVIC MMT:   MMT eval  Vaginal   Internal Anal Sphincter   External Anal Sphincter   Puborectalis   Diastasis Recti   (Blank rows = not tested)        TONE: ***  PROLAPSE: ***  TODAY'S TREATMENT  EVAL ***   PATIENT EDUCATION:  Education details: *** Person educated: {Person educated:25204} Education method: {Education Method:25205} Education comprehension: {Education Comprehension:25206}   HOME EXERCISE PROGRAM: ***  ASSESSMENT:  CLINICAL IMPRESSION: Patient is a *** y.o. *** who was seen today for physical therapy evaluation and treatment for ***.    OBJECTIVE IMPAIRMENTS {opptimpairments:25111}.   ACTIVITY LIMITATIONS {activitylimitations:27494}  PARTICIPATION LIMITATIONS: {participationrestrictions:25113}  PERSONAL FACTORS {Personal factors:25162} are also affecting patient's functional outcome.   REHAB POTENTIAL: {rehabpotential:25112}  CLINICAL DECISION MAKING: {clinical decision making:25114}  EVALUATION  COMPLEXITY: {Evaluation complexity:25115}   GOALS: Goals reviewed with patient? {yes/no:20286}  SHORT TERM GOALS: Target date: {follow up:25551}  *** Baseline: Goal status: {GOALSTATUS:25110}  2.  *** Baseline:  Goal status: {GOALSTATUS:25110}  3.  *** Baseline:  Goal status: {GOALSTATUS:25110}  4.  *** Baseline:  Goal status: {GOALSTATUS:25110}  5.  *** Baseline:  Goal status: {GOALSTATUS:25110}  6.  *** Baseline:  Goal status: {GOALSTATUS:25110}  LONG TERM GOALS: Target date: {follow up:25551}   ***  Baseline:  Goal status: {GOALSTATUS:25110}  2.  *** Baseline:  Goal status: {GOALSTATUS:25110}  3.  *** Baseline:  Goal status: {GOALSTATUS:25110}  4.  *** Baseline:  Goal status: {GOALSTATUS:25110}  5.  *** Baseline:  Goal status: {GOALSTATUS:25110}  6.  *** Baseline:  Goal status: {GOALSTATUS:25110}  PLAN: PT FREQUENCY: {rehab frequency:25116}  PT DURATION: {rehab duration:25117}  PLANNED INTERVENTIONS: {rehab planned interventions:25118::"Therapeutic exercises","Therapeutic activity","Neuromuscular re-education","Balance training","Gait training","Patient/Family education","Self Care","Joint mobilization"}  PLAN FOR NEXT SESSION: ***   Tashica Provencio, PT 05/18/2022, 12:05 PM

## 2022-05-20 ENCOUNTER — Ambulatory Visit: Payer: Medicaid Other | Admitting: Physical Therapy

## 2022-06-13 NOTE — Therapy (Addendum)
OUTPATIENT PHYSICAL THERAPY FEMALE PELVIC EVALUATION   Patient Name: Kelli Bean MRN: [id number] DOB:1986-01-06, 36 y.o., female Today's Date: 06/15/2022   PT End of Session - 06/15/22 1058     Visit Number 1    Date for PT Re-Evaluation 09/07/22    PT Start Time 1015    PT Stop Time 1100    PT Time Calculation (min) 45 min    Activity Tolerance Patient tolerated treatment well    Behavior During Therapy [facility] for tasks assessed/performed             Past Medical History:  Diagnosis Date   Anxiety    Depression    2008 on medication   GERD (gastroesophageal reflux disease)    Past Surgical History:  Procedure Laterality Date   LEEP     NO PAST SURGERIES     Patient Active Problem List   Diagnosis Date Noted   History of loop electrical excision procedure (LEEP) 02/24/2022   HGSIL (high grade squamous intraepithelial lesion) on Pap smear of cervix 01/27/2022   Anxiety disorder 05/15/2016   Depression 05/15/2016   History of ectopic pregnancy 05/23/2014   History of 2 spontaneous abortions 08/09/2013   Not immune to rubella 08/01/2012   History of miscarriage 07/30/2012    PCP: None  REFERRING PROVIDER: Jaquita Folds, MD  REFERRING DIAG: N39.3 (ICD-10-CM) - Stress incontinence  THERAPY DIAG:  No diagnosis found.  Rationale for Evaluation and Treatment Rehabilitation  ONSET DATE: 03/24/2019  SUBJECTIVE:                                                                                                                                                                                           SUBJECTIVE STATEMENT: Leakage started 3 years ago with last child. Patient reports she tore. Patient vaginal feels like it is gapping and not able to contract the pelvic floor. Patient has lost 30 pounds and working out to help with the strength. Patient has to urinate often when she drinks water. Weighted balls do not stay in.  Fluid intake: Yes: water, orange  juice, coffee     PAIN:  Are you having pain? No  PRECAUTIONS: None  WEIGHT BEARING RESTRICTIONS No  FALLS:  Has patient fallen in last 6 months? No  LIVING ENVIRONMENT: Lives with: lives with their family  OCCUPATION: home health care, sell insurance  PLOF: Independent  PATIENT GOALS find out what the problem is and work on pelvic floor strength  PERTINENT HISTORY:  leep  BOWEL MOVEMENT Pain with bowel movement: No  URINATION Pain with urination: No Fully empty bladder: No Stream: Weak,  difficulty starting urine stream, dribbling after finishing, and need to urinate multiple times in a row, urinary hesitancy Urgency: Yes: key in door, when she comes home, first thing in the morning Frequency: day time voids is 4; night time voids is 3 Leakage: Coughing, Sneezing, Laughing, and valsalva, exercise , jumping jacks Pads: Yes: 1-2 liners  INTERCOURSE; discomfort with dryness and no pain  PREGNANCY Vaginal deliveries 3 Tearing Yes: tore and since then felt gaping   PROLAPSE  Stage I anterior, Stage I posterior, Stage I apical prolapse    OBJECTIVE:   DIAGNOSTIC FINDINGS:  Pelvic floor strength II/V; PVR of 42 ml was obtained by bladder scan.  PATIENT SURVEYS:  PFIQ-7 39; UIQ-7 43  COGNITION:  Overall cognitive status: Within functional limits for tasks assessed     SENSATION:  Light touch: Appears intact  Proprioception: Appears intact                POSTURE: No Significant postural limitations   PELVIC ALIGNMENT:  LUMBARAROM/PROM Full Lumbar ROM   LOWER EXTREMITY ROM: full bilateral hip ROM   LOWER EXTREMITY MMT:  MMT Right eval Left eval  Hip extension 4/5 4/5  Hip abduction 4/5 and hip wants to come forward 4/5  Hip adduction 4/5 4/5    PALPATION:   General  bulges the lower abdomen with abdominal contraction;                 External Perineal Exam intact, good distance between the anus and vagina                              Internal Pelvic Floor decreased strength on the right urethra sphincter, good mobility of the cervix and bladder  Patient confirms identification and approves PT to assess internal pelvic floor and treatment Yes  PELVIC MMT:   MMT eval  Vaginal 2/5 with hug of therapist finger holding for 1-2 seconds  Diastasis Recti 1 finger width above umbilicus with good resistance  (Blank rows = not tested)        TONE: average  PROLAPSE: none  TODAY'S TREATMENT  EVAL Date: 06/15/2022 HEP established-see below    PATIENT EDUCATION:  Education details: educated patient on the anatomy of her vulva and perineum so she was aware of what she was looking at, Access Code: [place] educated: Patient Education method: Explanation, Demonstration, Tactile cues, Verbal cues, and Handouts Education comprehension: verbalized understanding, returned demonstration, verbal cues required, tactile cues required, and needs further education   HOME EXERCISE PROGRAM: Access Code: [facility] URL: [email or web address] Date: 06/15/2022 Prepared by: Earlie Counts  Exercises - Supine Pelvic Floor Contraction  - 2-3 x daily - 7 x weekly - 1 sets - 10 reps - 3 sec hold  ASSESSMENT:  CLINICAL IMPRESSION: Patient is a 36 y.o. female  who was seen today for physical therapy evaluation and treatment for stress incontinence. Patient reports her urinary leakage began after she had her last child on 03/24/2019. She feels her vagina is gapping. She will leak urine with coughing, sneezing,    OBJECTIVE IMPAIRMENTS decreased activity tolerance, decreased coordination, decreased endurance, and decreased strength.   ACTIVITY LIMITATIONS continence and exercise  PARTICIPATION LIMITATIONS: shopping  PERSONAL FACTORS Time since onset of injury/illness/exacerbation are also affecting patient's functional outcome.   REHAB POTENTIAL: Excellent  CLINICAL DECISION MAKING: Stable/uncomplicated  EVALUATION  COMPLEXITY: Low   GOALS: Goals reviewed with patient? Yes  SHORT TERM GOALS:  Target date: 07/13/2022  Patient educated on the anatomy of the vulva, perineum and pelvic floor to assist her in understanding her anatomy.  Baseline: Goal status: INITIAL  2.  Patient independent with initial HEP for pelvic floor program to reduce urinary leakage.  Baseline:  Goal status: INITIAL   LONG TERM GOALS: Target date: 09/07/2022   Patient independent with advanced HEP for core and pelvic floor program to reduce leakage.  Baseline:  Goal status: INITIAL  2.  Patient reports her urinary leakage coughing and sneezing decreased >/= 75% due to strength >/= 3/5 with good hug and lift.  Baseline: pelvic floor strength is 2/5 Goal status: INITIAL  3.  Patient able to return to jumping jacks with her workout without urinary leakage due to increased in pelvic floor strength >/= 3/5 and improved pressure management.  Baseline: pelvic floor strength is >/= 2/5 Goal status: INITIAL  4.  PFIQ-7 is less than 15 compared to 38 due to the ability to do her not as frustrated with her urinary leakage and feeling better to go out in the community.  Baseline: PFIQ-7 39 Goal status: INITIAL  5.  Patient urinary urgency has reduce to 1 time per week instead of daily when she gets home from work or the store.  Baseline: urgency daily with getting home and rushing to the bathroom Goal status: INITIAL   PLAN: PT FREQUENCY: 1x/week  PT DURATION: 12 weeks  PLANNED INTERVENTIONS: Therapeutic exercises, Therapeutic activity, Neuromuscular re-education, Patient/Family education, Self Care, Biofeedback, and Manual therapy  PLAN FOR NEXT SESSION: manual work to the introitus to improve circular contraction; lower abdominal contraction, pressure management with workout, pelvic floor contraction with cough and sneeze   Earlie Counts, PT 06/15/22 3:41 PM  PHYSICAL THERAPY DISCHARGE SUMMARY  Visits from Start of  Care: 1  Current functional level related to goals / functional outcomes: Patient has not met goals. She only attended the eval. She has no-showed for 2 visits so she is being discharged according to our attendance policy.    Remaining deficits: See above.    Education / Equipment: HEP   Patient agrees to discharge. Patient goals were not met. Patient is being discharged due to not returning since the last visit. Thank you for the referral. Earlie Counts, PT 07/11/22 1:11 PM

## 2022-06-15 ENCOUNTER — Other Ambulatory Visit: Payer: Self-pay

## 2022-06-15 ENCOUNTER — Encounter: Payer: Self-pay | Admitting: Physical Therapy

## 2022-06-15 ENCOUNTER — Ambulatory Visit: Payer: Medicaid Other | Attending: Obstetrics and Gynecology | Admitting: Physical Therapy

## 2022-06-15 DIAGNOSIS — R278 Other lack of coordination: Secondary | ICD-10-CM | POA: Diagnosis present

## 2022-06-15 DIAGNOSIS — M6281 Muscle weakness (generalized): Secondary | ICD-10-CM | POA: Insufficient documentation

## 2022-07-01 ENCOUNTER — Ambulatory Visit: Payer: Medicaid Other | Attending: Obstetrics and Gynecology | Admitting: Physical Therapy

## 2022-07-01 ENCOUNTER — Telehealth: Payer: Self-pay | Admitting: Physical Therapy

## 2022-07-01 DIAGNOSIS — R278 Other lack of coordination: Secondary | ICD-10-CM | POA: Insufficient documentation

## 2022-07-01 DIAGNOSIS — M6281 Muscle weakness (generalized): Secondary | ICD-10-CM | POA: Insufficient documentation

## 2022-07-01 NOTE — Telephone Encounter (Signed)
Called patient about her missed appointment today at 9:30. Left a message.  Earlie Counts, PT @10 /13/2023@ 9:53 AM

## 2022-07-08 ENCOUNTER — Ambulatory Visit: Payer: Medicaid Other | Admitting: Physical Therapy

## 2022-07-11 ENCOUNTER — Telehealth: Payer: Self-pay | Admitting: Physical Therapy

## 2022-07-11 ENCOUNTER — Ambulatory Visit: Payer: Medicaid Other | Admitting: Physical Therapy

## 2022-07-11 NOTE — Telephone Encounter (Signed)
Called patient about her missed visit today at 12:30. Left a message.  Earlie Counts, PT @10 /23/2023@ 1:13 PM

## 2022-07-22 ENCOUNTER — Encounter: Payer: Medicaid Other | Admitting: Physical Therapy

## 2022-07-27 ENCOUNTER — Encounter: Payer: Medicaid Other | Admitting: Physical Therapy

## 2022-08-05 ENCOUNTER — Encounter: Payer: Medicaid Other | Admitting: Physical Therapy

## 2022-09-23 ENCOUNTER — Emergency Department (HOSPITAL_BASED_OUTPATIENT_CLINIC_OR_DEPARTMENT_OTHER)
Admission: EM | Admit: 2022-09-23 | Discharge: 2022-09-23 | Disposition: A | Discharge status: Home / Self Care | Payer: Medicaid Other | Attending: Emergency Medicine | Admitting: Emergency Medicine

## 2022-09-23 ENCOUNTER — Encounter (HOSPITAL_BASED_OUTPATIENT_CLINIC_OR_DEPARTMENT_OTHER): Payer: Self-pay | Admitting: *Deleted

## 2022-09-23 ENCOUNTER — Other Ambulatory Visit: Payer: Self-pay

## 2022-09-23 DIAGNOSIS — H1033 Unspecified acute conjunctivitis, bilateral: Secondary | ICD-10-CM | POA: Insufficient documentation

## 2022-09-23 DIAGNOSIS — H5713 Ocular pain, bilateral: Secondary | ICD-10-CM | POA: Diagnosis present

## 2022-09-23 HISTORY — DX: Attention-deficit hyperactivity disorder, unspecified type: F90.9

## 2022-09-23 MED ORDER — FLUORESCEIN SODIUM 1 MG OP STRP
1.0000 | ORAL_STRIP | Freq: Once | OPHTHALMIC | Status: AC
  Administered 2022-09-23: 2 via OPHTHALMIC
  Filled 2022-09-23: qty 1

## 2022-09-23 MED ORDER — LORATADINE 10 MG PO TABS: 10.0000 mg | ORAL_TABLET | Freq: Every day | ORAL | 0 refills | Status: AC

## 2022-09-23 MED ORDER — TETRACAINE HCL 0.5 % OP SOLN
2.0000 [drp] | Freq: Once | OPHTHALMIC | Status: AC
  Administered 2022-09-23: 2 [drp] via OPHTHALMIC
  Filled 2022-09-23: qty 4

## 2022-09-23 MED ORDER — ERYTHROMYCIN 5 MG/GM OP OINT: TOPICAL_OINTMENT | OPHTHALMIC | 0 refills | Status: DC

## 2022-09-23 NOTE — Discharge Instructions (Signed)
Use the antihistamines and antibiotics as prescribed.  Follow-up with the ophthalmologist for recheck this week.  Return to the ED with visual changes, headache, increased tearing or drainage or other concerns.

## 2022-09-23 NOTE — ED Provider Notes (Signed)
[facility] EMERGENCY DEPT Provider Note   CSN: [id number] Arrival date & time: 09/23/22  0569     History  Chief Complaint  Patient presents with   Eye Pain    Kelli Bean is a 37 y.o. female.  Patient presents with a 3-day history of redness, swelling, burning sensation to her bilateral eyes.  States "the skin around the eyes burns".  When she wakes up in the morning the eyes are puffy and red with some yellow discharge but improved throughout the day.  However she still has burning sensation throughout the day.  Denies any tearing or discharge throughout the day.  No blurry vision or double vision.  No fever.  No cough, runny nose or sore throat.  No one else with similar symptoms.  Denies any new exposures to make-up, soaps, cleaning products or chemicals. Denies getting anything in her eyes.  Does not wear glasses or contacts. Does not do any woodworking or metal working. No pain with eye movement.  No photophobia or phonophobia.  No headache.  The history is provided by the patient.  Eye Pain Pertinent negatives include no chest pain, no headaches and no shortness of breath.       Home Medications Prior to Admission medications   Medication Sig Start Date End Date Taking? Authorizing Provider  busPIRone (BUSPAR) 10 MG tablet Take 10 mg by mouth 2 (two) times daily. 07/06/21   [provider]  FLUoxetine (PROZAC) 40 MG capsule TAKE 1 CAPSULE BY MOUTH DAILY FOR DEPRESSION OR ANXIETY 02/24/20   [provider]  norgestimate-ethinyl estradiol (ORTHO-CYCLEN) 0.25-35 MG-MCG tablet Take 1 tablet by mouth daily. 12/02/21   Shelly Bombard, MD  VYVANSE 30 MG capsule Take 30 mg by mouth daily. 06/22/21   [provider]      Allergies    Patient has no known allergies.    Review of Systems   Review of Systems  Constitutional:  Negative for activity change, appetite change, fatigue and fever.  HENT:  Negative for congestion.    Eyes:  Positive for pain, discharge, redness and itching. Negative for visual disturbance.  Respiratory:  Negative for cough, chest tightness and shortness of breath.   Cardiovascular:  Negative for chest pain.  Gastrointestinal:  Negative for nausea and vomiting.  Genitourinary:  Negative for dysuria and hematuria.  Musculoskeletal:  Negative for arthralgias and myalgias.  Skin:  Negative for rash.  Neurological:  Negative for weakness and headaches.   all other systems are negative except as noted in the HPI and PMH.    Physical Exam Updated Vital Signs BP 132/74 (BP Location: Left Arm)   Pulse 64   Temp 97.7 F (36.5 C) (Oral)   Resp 14   Ht 5\' 3"  (1.6 m)   Wt 57.6 kg   SpO2 100%   BMI 22.50 kg/m  Physical Exam Vitals and nursing note reviewed.  Constitutional:      General: She is not in acute distress.    Appearance: She is well-developed.  HENT:     Head: Normocephalic and atraumatic.     Mouth/Throat:     Pharynx: No oropharyngeal exudate.  Eyes:     General: Lids are normal. Vision grossly intact.        Right eye: No foreign body.        Left eye: No foreign body.     Intraocular pressure: Right eye pressure is 19 mmHg. Left eye pressure is 20 mmHg. Measurements were  taken using a handheld tonometer.    Extraocular Movements: Extraocular movements intact.     Conjunctiva/sclera:     Right eye: Right conjunctiva is injected. No exudate.    Left eye: Left conjunctiva is injected. No exudate.    Pupils: Pupils are equal, round, and reactive to light.     Right eye: No corneal abrasion or fluorescein uptake.     Left eye: No corneal abrasion or fluorescein uptake.     Slit lamp exam:    Right eye: No hyphema or hypopyon.     Left eye: No hyphema or hypopyon.     Comments: Slight erythema of upper and lower lips.  No pain with eye movements.  No areas of fluorescein uptake.  Neck:     Comments: No meningismus. Cardiovascular:     Rate and Rhythm: Normal rate  and regular rhythm.     Heart sounds: Normal heart sounds. No murmur heard. Pulmonary:     Effort: Pulmonary effort is normal. No respiratory distress.     Breath sounds: Normal breath sounds.  Abdominal:     Palpations: Abdomen is soft.     Tenderness: There is no abdominal tenderness. There is no guarding or rebound.  Musculoskeletal:        General: No tenderness. Normal range of motion.     Cervical back: Normal range of motion and neck supple.  Skin:    General: Skin is warm.  Neurological:     Mental Status: She is alert and oriented to person, place, and time.     Cranial Nerves: No cranial nerve deficit.     Motor: No abnormal muscle tone.     Coordination: Coordination normal.     Comments:  5/5 strength throughout. CN 2-12 intact.Equal grip strength.   Psychiatric:        Behavior: Behavior normal.     ED Results / Procedures / Treatments   Labs (all labs ordered are listed, but only abnormal results are displayed) Labs Reviewed - No data to display  EKG None  Radiology No results found.  Procedures Procedures    Medications Ordered in ED Medications  fluorescein ophthalmic strip 1 strip (2 strips Both Eyes Given 09/23/22 0854)  tetracaine (PONTOCAINE) 0.5 % ophthalmic solution 2 drop (2 drops Both Eyes Given 09/23/22 0854)    ED Course/ Medical Decision Making/ A&P                           Medical Decision Making Amount and/or Complexity of Data Reviewed Labs: ordered. Decision-making details documented in ED Course. Radiology: ordered and independent interpretation performed. Decision-making details documented in ED Course. ECG/medicine tests: ordered and independent interpretation performed. Decision-making details documented in ED Course.  Risk Prescription drug management.   3 days of burning sensation, redness, eye swelling and discharge.  No fever.  Vision is intact.  Favor likely allergic component of conjunctivitis we will treat with  antibiotics.  No areas of fluorescein uptake.  Intraocular pressures are normal.  pH is normal 7-8.  Visual Acuity Bilateral Distance: 20/15 R Distance: 20/15 L Distance: 20/13  Patiently given topical antibiotics with some concern for conjunctivitis will also give antihistamines.  Follow-up with PCP as well as ophthalmology.  No evidence of periorbital or orbital cellulitis.  Return precautions discussed.        Final Clinical Impression(s) / ED Diagnoses Final diagnoses:  None    Rx / DC Orders ED Discharge  Orders     None         Glynn Octave, MD 09/23/22 1002

## 2022-09-23 NOTE — ED Triage Notes (Signed)
Patient with 3 day hx of swelling and redness to both eyes.  The skin around her eyes is red and irritated.  Patient states she has not changed anything in her routine.  Patient reports yellow discharge in the morning.  Patient denies any known exposure to chemicals

## 2022-09-23 NOTE — ED Notes (Signed)
Patient verbalizes understanding of discharge instructions. Opportunity for questioning and answers were provided. Patient discharged from ED.  °

## 2022-10-31 ENCOUNTER — Encounter: Payer: Self-pay | Admitting: Nurse Practitioner

## 2022-10-31 ENCOUNTER — Ambulatory Visit (INDEPENDENT_AMBULATORY_CARE_PROVIDER_SITE_OTHER): Payer: Medicaid Other | Admitting: Nurse Practitioner

## 2022-10-31 VITALS — BP 102/68 | HR 83 | Temp 97.9°F | Ht 63.0 in | Wt 131.8 lb

## 2022-10-31 DIAGNOSIS — Z0000 Encounter for general adult medical examination without abnormal findings: Secondary | ICD-10-CM | POA: Diagnosis not present

## 2022-10-31 DIAGNOSIS — Z13220 Encounter for screening for lipoid disorders: Secondary | ICD-10-CM

## 2022-10-31 NOTE — Progress Notes (Signed)
$@Patientj$  ID: Kelli Bean, female    DOB: Mar 19, 1986, 37 y.o.   MRN: [id number]  Chief Complaint  Patient presents with   Establish Care    Not fasting     Referring provider: No ref. provider found   HPI  Patient presents today to establish care.  Patient does have a history of anxiety and depression.  She does see psychiatry for this issue.  Patient is established with OB/GYN.  She states that she has not had a PCP in several years.  She would like to get established and be proactive with her health care. Denies f/c/s, n/v/d, hemoptysis, PND, leg swelling Denies chest pain or edema       No Known Allergies  Immunization History  Administered Date(s) Administered   Influenza,inj,Quad PF,6+ Mos 09/06/2013, 11/20/2018   MMR 03/18/2014, 02/24/2019   PFIZER(Purple Top)SARS-COV-2 Vaccination 12/28/2019, 01/20/2020   Tdap 02/27/2014, 02/29/2016, 12/04/2018    Past Medical History:  Diagnosis Date   ADHD (attention deficit hyperactivity disorder)    Anxiety    Depression    2008 on medication   GERD (gastroesophageal reflux disease)     Tobacco History: Social History   Tobacco Use  Smoking Status Former  Smokeless Tobacco Never   Counseling given: Not Answered   Outpatient Encounter Medications as of 10/31/2022  Medication Sig   amphetamine-dextroamphetamine (ADDERALL XR) 20 MG 24 hr capsule Take 20 mg by mouth every morning.   busPIRone (BUSPAR) 10 MG tablet Take 10 mg by mouth 2 (two) times daily.   FLUoxetine (PROZAC) 40 MG capsule TAKE 1 CAPSULE BY MOUTH DAILY FOR DEPRESSION OR ANXIETY   hydrOXYzine (ATARAX) 10 MG tablet Take 10 mg by mouth 2 (two) times daily.   loratadine (CLARITIN) 10 MG tablet Take 1 tablet (10 mg total) by mouth daily. (Patient not taking: Reported on 10/31/2022)   norgestimate-ethinyl estradiol (ORTHO-CYCLEN) 0.25-35 MG-MCG tablet Take 1 tablet by mouth daily. (Patient not taking: Reported on 10/31/2022)   [DISCONTINUED]  erythromycin ophthalmic ointment Place a 1/2 inch ribbon of ointment into the lower eyelid 4 times daily x 1 week (Patient not taking: Reported on 10/31/2022)   [DISCONTINUED] VYVANSE 30 MG capsule Take 30 mg by mouth daily. (Patient not taking: Reported on 10/31/2022)   No facility-administered encounter medications on file as of 10/31/2022.     Review of Systems  Review of Systems  Constitutional: Negative.   HENT: Negative.    Cardiovascular: Negative.   Gastrointestinal: Negative.   Allergic/Immunologic: Negative.   Neurological: Negative.   Psychiatric/Behavioral: Negative.         Physical Exam  BP 102/68   Pulse 83   Temp 97.9 F (36.6 C)   Ht 5' 3"$  (1.6 m)   Wt 131 lb 12.8 oz (59.8 kg)   LMP 10/12/2022   SpO2 100%   BMI 23.35 kg/m   Wt Readings from Last 5 Encounters:  10/31/22 131 lb 12.8 oz (59.8 kg)  09/23/22 127 lb (57.6 kg)  04/05/22 130 lb (59 kg)  02/24/22 128 lb (58.1 kg)  01/27/22 129 lb (58.5 kg)     Physical Exam Vitals and nursing note reviewed.  Constitutional:      General: She is not in acute distress.    Appearance: She is well-developed.  Cardiovascular:     Rate and Rhythm: Normal rate and regular rhythm.  Pulmonary:     Effort: Pulmonary effort is normal.     Breath sounds: Normal breath sounds.  Neurological:  Mental Status: She is alert and oriented to person, place, and time.       Assessment & Plan:   Lipid screening - Lipid Panel   2. Routine adult health maintenance  - CBC - Comprehensive metabolic panel   Follow up:  Follow up in 12 months     Fenton Foy, NP 10/31/2022

## 2022-10-31 NOTE — Patient Instructions (Addendum)
1. Lipid screening  - Lipid Panel   2. Routine adult health maintenance  - CBC - Comprehensive metabolic panel   Follow up:  Follow up in 12 months

## 2022-10-31 NOTE — Assessment & Plan Note (Signed)
-   Lipid Panel   2. Routine adult health maintenance  - CBC - Comprehensive metabolic panel   Follow up:  Follow up in 3 months

## 2022-11-01 LAB — COMPREHENSIVE METABOLIC PANEL
ALT: 12 IU/L (ref 0–32)
AST: 16 IU/L (ref 0–40)
Albumin/Globulin Ratio: 1.8 (ref 1.2–2.2)
Albumin: 3.9 g/dL (ref 3.9–4.9)
Alkaline Phosphatase: 69 IU/L (ref 44–121)
BUN/Creatinine Ratio: 10 (ref 9–23)
BUN: 7 mg/dL (ref 6–20)
Bilirubin Total: <0.2 mg/dL (ref 0.0–1.2)
CO2: 23 mmol/L (ref 20–29)
Calcium: 8.5 mg/dL — ABNORMAL LOW (ref 8.7–10.2)
Chloride: 109 mmol/L — ABNORMAL HIGH (ref 96–106)
Creatinine, Ser: 0.72 mg/dL (ref 0.57–1.00)
Globulin, Total: 2.2 g/dL (ref 1.5–4.5)
Glucose: 72 mg/dL (ref 70–99)
Potassium: 4.2 mmol/L (ref 3.5–5.2)
Sodium: 144 mmol/L (ref 134–144)
Total Protein: 6.1 g/dL (ref 6.0–8.5)
eGFR: 110 mL/min/{1.73_m2} (ref >59)

## 2022-11-01 LAB — LIPID PANEL
Chol/HDL Ratio: 2.8 ratio (ref 0.0–4.4)
Cholesterol, Total: 155 mg/dL (ref 100–199)
HDL: 56 mg/dL (ref >39)
LDL Chol Calc (NIH): 84 mg/dL (ref 0–99)
Triglycerides: 79 mg/dL (ref 0–149)
VLDL Cholesterol Cal: 15 mg/dL (ref 5–40)

## 2022-11-01 LAB — CBC
Hematocrit: 37.5 % (ref 34.0–46.6)
Hemoglobin: 12.5 g/dL (ref 11.1–15.9)
MCH: 31.9 pg (ref 26.6–33.0)
MCHC: 33.3 g/dL (ref 31.5–35.7)
MCV: 96 fL (ref 79–97)
Platelets: 290 10*3/uL (ref 150–450)
RBC: 3.92 x10E6/uL (ref 3.77–5.28)
RDW: 12.5 % (ref 11.7–15.4)
WBC: 6 10*3/uL (ref 3.4–10.8)

## 2022-11-04 ENCOUNTER — Telehealth: Payer: Self-pay

## 2022-11-04 NOTE — Telephone Encounter (Signed)
I left a message for the patient to return my call.

## 2022-11-04 NOTE — Telephone Encounter (Signed)
-----   Message from Fenton Foy, NP sent at 11/03/2022  4:16 PM EST ----- Labs are stable

## 2022-11-09 ENCOUNTER — Telehealth: Payer: Self-pay | Admitting: Nurse Practitioner

## 2022-11-09 NOTE — Telephone Encounter (Signed)
Patient LVM on nurse line returning Tarsha's call regarding lab results. Please call back with results.

## 2023-08-11 ENCOUNTER — Ambulatory Visit: Payer: Medicaid Other | Admitting: Obstetrics and Gynecology

## 2023-08-23 ENCOUNTER — Ambulatory Visit: Payer: Medicaid Other | Admitting: Obstetrics and Gynecology

## 2023-08-28 ENCOUNTER — Ambulatory Visit: Payer: Self-pay | Admitting: Nurse Practitioner

## 2023-10-24 ENCOUNTER — Encounter (HOSPITAL_COMMUNITY): Payer: Self-pay | Admitting: Obstetrics and Gynecology

## 2023-10-24 ENCOUNTER — Other Ambulatory Visit: Payer: Self-pay

## 2023-10-24 ENCOUNTER — Inpatient Hospital Stay (HOSPITAL_COMMUNITY)
Admission: AD | Admit: 2023-10-24 | Discharge: 2023-10-24 | Disposition: A | Discharge status: Home / Self Care | Payer: Medicaid Other | Attending: Obstetrics and Gynecology | Admitting: Obstetrics and Gynecology

## 2023-10-24 ENCOUNTER — Inpatient Hospital Stay (HOSPITAL_COMMUNITY): Payer: Medicaid Other

## 2023-10-24 DIAGNOSIS — O3680X0 Pregnancy with inconclusive fetal viability, not applicable or unspecified: Secondary | ICD-10-CM | POA: Diagnosis not present

## 2023-10-24 DIAGNOSIS — Z3A01 Less than 8 weeks gestation of pregnancy: Secondary | ICD-10-CM

## 2023-10-24 DIAGNOSIS — O200 Threatened abortion: Secondary | ICD-10-CM

## 2023-10-24 DIAGNOSIS — Z3201 Encounter for pregnancy test, result positive: Secondary | ICD-10-CM | POA: Insufficient documentation

## 2023-10-24 DIAGNOSIS — R109 Unspecified abdominal pain: Secondary | ICD-10-CM | POA: Insufficient documentation

## 2023-10-24 LAB — URINALYSIS, ROUTINE W REFLEX MICROSCOPIC
Bilirubin Urine: NEGATIVE
Glucose, UA: NEGATIVE mg/dL
Hgb urine dipstick: NEGATIVE
Ketones, ur: NEGATIVE mg/dL
Nitrite: NEGATIVE
Protein, ur: NEGATIVE mg/dL
Specific Gravity, Urine: 1.008 (ref 1.005–1.030)
pH: 6 (ref 5.0–8.0)

## 2023-10-24 LAB — HIV ANTIBODY (ROUTINE TESTING W REFLEX): HIV Screen 4th Generation wRfx: NONREACTIVE

## 2023-10-24 LAB — CBC
HCT: 41.6 % (ref 36.0–46.0)
Hemoglobin: 13.7 g/dL (ref 12.0–15.0)
MCH: 31.5 pg (ref 26.0–34.0)
MCHC: 32.9 g/dL (ref 30.0–36.0)
MCV: 95.6 fL (ref 80.0–100.0)
Platelets: 334 10*3/uL (ref 150–400)
RBC: 4.35 MIL/uL (ref 3.87–5.11)
RDW: 13.2 % (ref 11.5–15.5)
WBC: 6.1 10*3/uL (ref 4.0–10.5)
nRBC: 0 % (ref 0.0–0.2)

## 2023-10-24 LAB — POCT PREGNANCY, URINE: Preg Test, Ur: POSITIVE — AB

## 2023-10-24 LAB — WET PREP, GENITAL
Clue Cells Wet Prep HPF POC: NONE SEEN
Sperm: NONE SEEN
Trich, Wet Prep: NONE SEEN
WBC, Wet Prep HPF POC: <10 (ref <10)
Yeast Wet Prep HPF POC: NONE SEEN

## 2023-10-24 LAB — RPR: RPR Ser Ql: NONREACTIVE

## 2023-10-24 LAB — HCG, QUANTITATIVE, PREGNANCY: hCG, Beta Chain, Quant, S: 5873 m[IU]/mL — ABNORMAL HIGH (ref <5)

## 2023-10-24 LAB — GC/CHLAMYDIA PROBE AMP (~~LOC~~) NOT AT ARMC
Chlamydia: NEGATIVE
Comment: NEGATIVE
Comment: NORMAL
Neisseria Gonorrhea: NEGATIVE

## 2023-10-24 NOTE — MAU Note (Signed)
 Kelli Bean is a 38 y.o. at Unknown here in MAU reporting: she was referred to MAU from [facility] for symptoms of cramping, dizziness, and night sweats.  Denies VB.  Reports has had an ectopic pregnancy in the past.  States has had 4 +HPT.  LMP: 09/12/2023 Onset of complaint: 5 days ago Pain score: 0 Vitals:   10/24/23 0823  BP: 115/79  Pulse: 72  Resp: 18  Temp: 98 F (36.7 C)  SpO2: 100%     FHT: NA  Lab orders placed from triage: UPT

## 2023-10-24 NOTE — MAU Provider Note (Signed)
 History     CSN: [id number]  Arrival date and time: 10/24/23 0808   Event Date/Time   First Provider Initiated Contact with Patient 10/24/23 1142      Chief Complaint  Patient presents with   Hx Ectopic   HPI Ms. Kelli Bean is a 38 y.o. year old [phone] female at [redacted]w[redacted]d weeks gestation who presents to MAU reporting she was told by the pregnancy to come to MAU for symptoms of cramping dizziness and night sweats prior to today.  She has a history of an ectopic pregnancy in the past.  She has taken 4 positive home pregnancy test.  Reports her LMP as 09/12/2023.  She reports her symptoms were 5 days ago.  She denies any vaginal bleeding or pain.  OB History     Gravida  8   Para  3   Term  3   Preterm      AB  4   Living  3      SAB  3   IAB      Ectopic  1   Multiple  0   Live Births  3           Past Medical History:  Diagnosis Date   ADHD (attention deficit hyperactivity disorder)    Anxiety    Depression    2008 on medication   GERD (gastroesophageal reflux disease)     Past Surgical History:  Procedure Laterality Date   LEEP     NO PAST SURGERIES      Family History  Problem Relation Age of Onset   Hypertension Mother    Hypertension Father    Cancer Maternal Grandmother    Diabetes Maternal Grandmother    Cancer Paternal Grandmother    Diabetes Paternal Grandmother     Social History   Tobacco Use   Smoking status: Former   Smokeless tobacco: Never  [employer]   Vaping status: Never Used  Substance Use Topics   Alcohol  use: Yes    Comment: socially   Drug use: No    Allergies: No Known Allergies  No medications prior to admission.    Review of Systems  Constitutional: Negative.   HENT: Negative.    Eyes: Negative.   Respiratory: Negative.    Cardiovascular: Negative.   Gastrointestinal: Negative.   Endocrine: Negative.   Genitourinary: Negative.   Musculoskeletal: Negative.   Skin: Negative.    Allergic/Immunologic: Negative.   Neurological: Negative.   Hematological: Negative.   Psychiatric/Behavioral: Negative.     Physical Exam   Blood pressure 107/75, pulse 70, temperature 98.7 F (37.1 C), temperature source Oral, resp. rate 19, height 5' 2 (1.575 m), weight 65.4 kg, last menstrual period 09/12/2023, SpO2 100%.  Physical Exam Vitals and nursing note reviewed.  Constitutional:      Appearance: Normal appearance. She is obese.  Pulmonary:     Effort: Pulmonary effort is normal.  Genitourinary:    Comments: Swabs collected by patient using blind swab technique  Neurological:     Mental Status: She is alert and oriented to person, place, and time.  Psychiatric:        Mood and Affect: Mood normal.        Behavior: Behavior normal.        Thought Content: Thought content normal.        Judgment: Judgment normal.     MAU Course  Procedures  MDM [facility]UA UPT CBC ABO/Rh Wet prep GC/CT -- Results  pending  OB transvaginal ultrasound HCG  Results for orders placed or performed during the hospital encounter of 10/24/23 (from the past 72 hours)  Pregnancy, urine POC     Status: Abnormal   Collection Time: 10/24/23  8:33 AM  Result Value Ref Range   Preg Test, Ur POSITIVE (A) NEGATIVE    Comment:        THE SENSITIVITY OF THIS METHODOLOGY IS >24 mIU/mL   Urinalysis, Routine w reflex microscopic -Urine, Clean Catch     Status: Abnormal   Collection Time: 10/24/23  9:07 AM  Result Value Ref Range   Color, Urine STRAW (A) YELLOW   APPearance CLEAR CLEAR   Specific Gravity, Urine 1.008 1.005 - 1.030   pH 6.0 5.0 - 8.0   Glucose, UA NEGATIVE NEGATIVE mg/dL   Hgb urine dipstick NEGATIVE NEGATIVE   Bilirubin Urine NEGATIVE NEGATIVE   Ketones, ur NEGATIVE NEGATIVE mg/dL   Protein, ur NEGATIVE NEGATIVE mg/dL   Nitrite NEGATIVE NEGATIVE   Leukocytes,Ua MODERATE (A) NEGATIVE   RBC / HPF 0-5 0 - 5 RBC/hpf   WBC, UA 21-50 0 - 5 WBC/hpf   Bacteria, UA RARE (A)  NONE SEEN   Squamous Epithelial / HPF 0-5 0 - 5 /HPF    Comment: Performed at [facility], [address] [place], [place] [address]  GC/Chlamydia probe amp ([place])not at [facility]     Status: None   Collection Time: 10/24/23  9:44 AM  Result Value Ref Range   Neisseria Gonorrhea Negative    Chlamydia Negative    Comment Normal Reference Ranger Chlamydia - Negative    Comment      Normal Reference Range Neisseria Gonorrhea - Negative  CBC     Status: None   Collection Time: 10/24/23 10:12 AM  Result Value Ref Range   WBC 6.1 4.0 - 10.5 K/uL   RBC 4.35 3.87 - 5.11 MIL/uL   Hemoglobin 13.7 12.0 - 15.0 g/dL   HCT 58.3 63.9 - 53.9 %   MCV 95.6 80.0 - 100.0 fL   MCH 31.5 26.0 - 34.0 pg   MCHC 32.9 30.0 - 36.0 g/dL   RDW 86.7 88.4 - 84.4 %   Platelets 334 150 - 400 K/uL   nRBC 0.0 0.0 - 0.2 %    Comment: Performed at [facility], [address], [place], [place] [address]  hCG, quantitative, pregnancy     Status: Abnormal   Collection Time: 10/24/23 10:12 AM  Result Value Ref Range   hCG, Beta Chain, Quant, S 5,873 (H) <5 mIU/mL    Comment:          GEST. AGE      CONC.  (mIU/mL)   <=1 WEEK        5 - 50     2 WEEKS       50 - 500     3 WEEKS       100 - 10,000     4 WEEKS     1,000 - 30,000     5 WEEKS     3,500 - 115,000   6-8 WEEKS     12,000 - 270,000    12 WEEKS     15,000 - 220,000        FEMALE AND NON-PREGNANT FEMALE:     LESS THAN 5 mIU/mL Performed at [facility], [address], [place], [place] [address]   Wet prep, genital  Status: None   Collection Time: 10/24/23 10:12 AM  Result Value Ref Range   Yeast Wet Prep HPF POC NONE SEEN NONE SEEN   Trich, Wet Prep NONE SEEN NONE SEEN   Clue Cells Wet Prep HPF POC NONE SEEN NONE SEEN   WBC, Wet Prep HPF POC <10 <10   Sperm NONE SEEN     Comment: Performed at [facility], [address] [place], [place] [address]  HIV Antibody (routine testing w rflx)     Status: None   Collection  Time: 10/24/23 10:12 AM  Result Value Ref Range   HIV Screen 4th Generation wRfx Non Reactive Non Reactive    Comment: Performed at [facility], [address] [place], [place] [address]  RPR     Status: None   Collection Time: 10/24/23 10:12 AM  Result Value Ref Range   RPR Ser Ql NON REACTIVE NON REACTIVE    Comment: Performed at [facility], [address], [place], [place] [address]   US  OB LESS THAN 14 WEEKS WITH OB TRANSVAGINAL Result Date: 10/24/2023 CLINICAL DATA:  Abdominal cramping. Beta hCG is not available at the time of interpretation. EXAM: OBSTETRIC <14 WK US  AND TRANSVAGINAL OB US  TECHNIQUE: Both transabdominal and transvaginal ultrasound examinations were performed for complete evaluation of the gestation as well as the maternal uterus, adnexal regions, and pelvic cul-de-sac. Transvaginal technique was performed to assess early pregnancy. COMPARISON:  None Available. FINDINGS: Intrauterine gestational sac: Single Yolk sac:  Visualized. Embryo:  Not Visualized. MSD: 6.2 mm   5 w   2 d Subchorionic hemorrhage: Large subchorionic hemorrhage measures 1.6 x 0.9 cm. Maternal uterus/adnexae: Retroflexed uterus. Small volume pelvic free fluid. Normal ovaries. IMPRESSION: Probable early intrauterine gestational sac and yolk sac, but no fetal pole or cardiac activity yet visualized. Recommend follow-up quantitative B-HCG levels and follow-up US  in 14 days to assess viability. This recommendation follows SRU consensus guidelines: Diagnostic Criteria for Nonviable Pregnancy Early in the First Trimester. LOISE Diedra PARAS Med 2013; 630:8556-48. Electronically Signed   By: Limin  Xu M.D.   On: 10/24/2023 11:32    Assessment and Plan  1. Pregnancy with uncertain fetal viability, single or unspecified fetus (Primary) - US  OB LESS THAN 14 WEEKS WITH OB TRANSVAGINAL; Future  2. Threatened miscarriage in early pregnancy - Information provided on [facility] and threatened miscarriage - Return to  MAU: If you have heavier bleeding that soaks through more that 2 pads per hour for an hour or more If you bleed so much that you feel like you might pass out or you do pass out If you have significant abdominal pain that is not improved with Tylenol  1000 mg every 8 hours as needed for pain If you develop a fever > 100.5    3. [redacted] weeks gestation of pregnancy   - Discharge patient - Keep scheduled appt with [facility] in 2 weeks - Patient verbalized an understanding of the plan of care and agrees.    Ala Cart, CNM 10/24/2023, 11:42 AM

## 2023-10-24 NOTE — Discharge Instructions (Signed)
 Return to MAU: If you have heavier bleeding that soaks through more that 2 pads per hour for an hour or more If you bleed so much that you feel like you might pass out or you do pass out If you have significant abdominal pain that is not improved with Tylenol 1000 mg every 8 hours as needed for pain If you develop a fever > 100.5

## 2023-10-30 ENCOUNTER — Ambulatory Visit: Payer: Self-pay | Admitting: Nurse Practitioner

## 2023-11-10 ENCOUNTER — Other Ambulatory Visit (INDEPENDENT_AMBULATORY_CARE_PROVIDER_SITE_OTHER): Payer: Self-pay

## 2023-11-10 ENCOUNTER — Ambulatory Visit: Payer: Medicaid Other | Admitting: *Deleted

## 2023-11-10 VITALS — BP 114/76 | HR 74 | Wt 147.5 lb

## 2023-11-10 DIAGNOSIS — Z3A08 8 weeks gestation of pregnancy: Secondary | ICD-10-CM

## 2023-11-10 DIAGNOSIS — Z3481 Encounter for supervision of other normal pregnancy, first trimester: Secondary | ICD-10-CM | POA: Diagnosis not present

## 2023-11-10 DIAGNOSIS — Z1339 Encounter for screening examination for other mental health and behavioral disorders: Secondary | ICD-10-CM

## 2023-11-10 DIAGNOSIS — O3680X0 Pregnancy with inconclusive fetal viability, not applicable or unspecified: Secondary | ICD-10-CM

## 2023-11-10 DIAGNOSIS — O0990 Supervision of high risk pregnancy, unspecified, unspecified trimester: Secondary | ICD-10-CM | POA: Insufficient documentation

## 2023-11-10 NOTE — Patient Instructions (Signed)

## 2023-11-10 NOTE — Progress Notes (Signed)
 New OB Intake  I connected with Kelli Bean  on 11/10/23 at 10:15 AM EST by In Person Visit and verified that I am speaking with the correct person using two identifiers. Nurse is located at [facility] and pt is located at [facility].  I discussed the limitations, risks, security and privacy concerns of performing an evaluation and management service by telephone and the availability of in person appointments. I also discussed with the patient that there may be a patient responsible charge related to this service. The patient expressed understanding and agreed to proceed.  I explained I am completing New OB Intake today. We discussed EDD of 06/18/2024, by Last Menstrual Period. Pt is [id number]. I reviewed her allergies, medications and Medical/Surgical/OB history.    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 Discussed the nature of our practice with multiple providers including residents and students. Due to the size of the practice, the delivering provider may not be the same as those providing prenatal care.   Patient is not interested in water birth. Offered upcoming OB visit with CNM to discuss further.  MyChart/Babyscripts MyChart access verified. I explained pt will have some visits in office and some virtually. Babyscripts instructions given and order placed. Patient verifies receipt of registration text/e-mail. Account successfully created and app downloaded. If patient is a candidate for Optimized  scheduling, add to sticky note.   Blood Pressure Cuff/Weight Scale TBD Explained after first prenatal appt pt will check weekly and document in Babyscripts. Patient does not have weight scale; patient may purchase if they desire to track weight weekly in Babyscripts.  Anatomy US Explained first scheduled [place] will be around 19 weeks. Anatomy US scheduled for TBD at TBD.  Interested in [place]? If yes, send referral and doula dot phrase.   Is patient a candidate for Babyscripts Optimization? No, due to Risk Factors   First visit review I reviewed new OB appt with patient. Explained pt will be seen by Dr. Jolayne Panther at first visit. Discussed Avelina Laine genetic screening with patient. Considering [facility] and [facility].. Routine prenatal labs  OB Urine only collected at today's visit. Initial OB labs deferred to New OB appt.    Last Pap Diagnosis  Date Value Ref Range Status  09/01/2021 (A)  Final   - High grade squamous intraepithelial lesion (HSIL)    Harrel Lemon, RN 11/10/2023  10:22 AM

## 2023-11-17 LAB — URINE CULTURE, OB REFLEX

## 2023-11-17 LAB — CULTURE, OB URINE

## 2023-11-20 MED ORDER — CEPHALEXIN 500 MG PO CAPS: 500.0000 mg | ORAL_CAPSULE | Freq: Four times a day (QID) | ORAL | 2 refills | Status: AC

## 2023-11-20 NOTE — Addendum Note (Signed)
 Addended by: Catalina Antigua on: 11/20/2023 03:44 PM   Modules accepted: Orders

## 2023-11-29 ENCOUNTER — Encounter: Payer: Self-pay | Admitting: Nurse Practitioner

## 2023-11-29 ENCOUNTER — Ambulatory Visit (INDEPENDENT_AMBULATORY_CARE_PROVIDER_SITE_OTHER): Payer: Medicaid Other | Admitting: Nurse Practitioner

## 2023-11-29 VITALS — BP 109/89 | HR 82 | Temp 98.4°F | Wt 144.8 lb

## 2023-11-29 DIAGNOSIS — Z13220 Encounter for screening for lipoid disorders: Secondary | ICD-10-CM | POA: Diagnosis not present

## 2023-11-29 DIAGNOSIS — Z0000 Encounter for general adult medical examination without abnormal findings: Secondary | ICD-10-CM | POA: Diagnosis not present

## 2023-11-29 NOTE — Patient Instructions (Signed)

## 2023-11-29 NOTE — Progress Notes (Signed)
 Subjective   Patient ID: Kelli Bean, female    DOB: 1986/03/17, 38 y.o.   MRN: [id number]  Chief Complaint  Patient presents with   Annual Exam    Referring provider: Ivonne Andrew, NP  Kelli Bean is a 37 y.o. female with Past Medical History: No date: ADHD (attention deficit hyperactivity disorder) No date: Anxiety No date: Depression     Comment:  2008 on medication No date: GERD (gastroesophageal reflux disease)   HPI  Patient presents today for annual exam.  She continues to follow with psychiatry for anxiety and depression.  She does have an OB/GYN. Patient is not fasting.  No new issues or concerns today. Denies f/c/s, n/v/d, hemoptysis, PND, leg swelling Denies chest pain or edema.   No Known Allergies  Immunization History  Administered Date(s) Administered   Influenza,inj,Quad PF,6+ Mos 09/06/2013, 11/20/2018   MMR 03/18/2014, 02/24/2019   PFIZER(Purple Top)SARS-COV-2 Vaccination 12/28/2019, 01/20/2020   Tdap 02/27/2014, 02/29/2016, 12/04/2018    Tobacco History: Social History   Tobacco Use  Smoking Status Former  Smokeless Tobacco Never   Counseling given: Not Answered   Outpatient Encounter Medications as of 11/29/2023  Medication Sig   amphetamine-dextroamphetamine (ADDERALL) 20 MG tablet Take 20 mg by mouth every morning.   busPIRone (BUSPAR) 10 MG tablet Take 10 mg by mouth 2 (two) times daily.   hydrOXYzine (ATARAX) 10 MG tablet Take 10 mg by mouth 2 (two) times daily.   cephALEXin (KEFLEX) 500 MG capsule Take 1 capsule (500 mg total) by mouth 4 (four) times daily. (Patient not taking: Reported on 11/29/2023)   FLUoxetine (PROZAC) 40 MG capsule TAKE 1 CAPSULE BY MOUTH DAILY FOR DEPRESSION OR ANXIETY (Patient not taking: Reported on 11/29/2023)   loratadine (CLARITIN) 10 MG tablet Take 1 tablet (10 mg total) by mouth daily. (Patient not taking: Reported on 11/29/2023)   No facility-administered encounter medications on file as  of 11/29/2023.    Review of Systems  Review of Systems  Constitutional: Negative.   HENT: Negative.    Cardiovascular: Negative.   Gastrointestinal: Negative.   Allergic/Immunologic: Negative.   Neurological: Negative.   Psychiatric/Behavioral: Negative.       Objective:   BP 109/89   Pulse 82   Temp 98.4 F (36.9 C) (Oral)   Wt 144 lb 12.8 oz (65.7 kg)   LMP 09/12/2023 (Exact Date)   SpO2 100%   Breastfeeding Unknown   BMI 26.48 kg/m   Wt Readings from Last 5 Encounters:  11/29/23 144 lb 12.8 oz (65.7 kg)  11/10/23 147 lb 8 oz (66.9 kg)  10/24/23 144 lb 3.2 oz (65.4 kg)  10/31/22 131 lb 12.8 oz (59.8 kg)  09/23/22 127 lb (57.6 kg)     Physical Exam Vitals and nursing note reviewed.  Constitutional:      General: She is not in acute distress.    Appearance: She is well-developed.  Cardiovascular:     Rate and Rhythm: Normal rate and regular rhythm.  Pulmonary:     Effort: Pulmonary effort is normal.     Breath sounds: Normal breath sounds.  Neurological:     Mental Status: She is alert and oriented to person, place, and time.       Assessment & Plan:   Routine adult health maintenance -     CBC -     Comprehensive metabolic panel  Lipid screening -     Lipid panel     No follow-ups on file.  Ivonne Andrew, NP 11/29/2023

## 2023-11-30 LAB — COMPREHENSIVE METABOLIC PANEL
ALT: 15 IU/L (ref 0–32)
AST: 19 IU/L (ref 0–40)
Albumin: 4.1 g/dL (ref 3.9–4.9)
Alkaline Phosphatase: 79 IU/L (ref 44–121)
BUN/Creatinine Ratio: 11 (ref 9–23)
BUN: 8 mg/dL (ref 6–20)
Bilirubin Total: <0.2 mg/dL (ref 0.0–1.2)
CO2: 25 mmol/L (ref 20–29)
Calcium: 9.3 mg/dL (ref 8.7–10.2)
Chloride: 104 mmol/L (ref 96–106)
Creatinine, Ser: 0.76 mg/dL (ref 0.57–1.00)
Globulin, Total: 2.6 g/dL (ref 1.5–4.5)
Glucose: 85 mg/dL (ref 70–99)
Potassium: 4.3 mmol/L (ref 3.5–5.2)
Sodium: 142 mmol/L (ref 134–144)
Total Protein: 6.7 g/dL (ref 6.0–8.5)
eGFR: 103 mL/min/{1.73_m2} (ref >59)

## 2023-11-30 LAB — CBC
Hematocrit: 37.9 % (ref 34.0–46.6)
Hemoglobin: 12.5 g/dL (ref 11.1–15.9)
MCH: 31.5 pg (ref 26.6–33.0)
MCHC: 33 g/dL (ref 31.5–35.7)
MCV: 96 fL (ref 79–97)
Platelets: 325 10*3/uL (ref 150–450)
RBC: 3.97 x10E6/uL (ref 3.77–5.28)
RDW: 12.3 % (ref 11.7–15.4)
WBC: 6 10*3/uL (ref 3.4–10.8)

## 2023-11-30 LAB — LIPID PANEL
Chol/HDL Ratio: 3.8 ratio (ref 0.0–4.4)
Cholesterol, Total: 208 mg/dL — ABNORMAL HIGH (ref 100–199)
HDL: 55 mg/dL (ref >39)
LDL Chol Calc (NIH): 143 mg/dL — ABNORMAL HIGH (ref 0–99)
Triglycerides: 56 mg/dL (ref 0–149)
VLDL Cholesterol Cal: 10 mg/dL (ref 5–40)

## 2023-12-05 ENCOUNTER — Encounter: Payer: Medicaid Other | Admitting: Obstetrics and Gynecology

## 2023-12-05 DIAGNOSIS — O0990 Supervision of high risk pregnancy, unspecified, unspecified trimester: Secondary | ICD-10-CM

## 2023-12-05 DIAGNOSIS — Z3A12 12 weeks gestation of pregnancy: Secondary | ICD-10-CM

## 2024-02-26 ENCOUNTER — Ambulatory Visit: Admitting: Obstetrics

## 2024-03-04 ENCOUNTER — Ambulatory Visit: Admitting: Obstetrics

## 2024-03-14 ENCOUNTER — Ambulatory Visit: Admitting: Obstetrics

## 2024-09-30 ENCOUNTER — Encounter: Payer: Self-pay | Admitting: *Deleted

## 2024-10-01 ENCOUNTER — Telehealth: Admitting: Nurse Practitioner

## 2024-10-01 DIAGNOSIS — F419 Anxiety disorder, unspecified: Secondary | ICD-10-CM | POA: Diagnosis not present

## 2024-10-01 DIAGNOSIS — F331 Major depressive disorder, recurrent, moderate: Secondary | ICD-10-CM | POA: Diagnosis not present

## 2024-10-01 DIAGNOSIS — F900 Attention-deficit hyperactivity disorder, predominantly inattentive type: Secondary | ICD-10-CM | POA: Diagnosis not present

## 2024-10-01 NOTE — Assessment & Plan Note (Signed)
 SABRA

## 2024-10-01 NOTE — Progress Notes (Signed)
" ° °  Subjective:  I'm doing good but need to talk about my ADHD medication.    HPI: Kelli Bean is a 39 y.o. female presenting on 10/01/2024 via telehealth for medication follow up.  Patient reports she has been doing well overall. Work has been a little tough as she is struggling with focus and concentration towards midday and feels her current ADHD medication is wearing off sooner and would like an increase in her dose. We discussed adding a booster dose around noon and start with 1/2 tablet (10 mg of the 20 mg). Other than that, she reports improvement with decreased anxiety, depressive symptoms, apprehensions worries and remains stable on her Prozac and Buspar.  She denies poor appetite, sleep issues, psychosis, delusions, suicidal or homicidal ideations.    ROS: Negative unless specifically indicated above in HPI.   Relevant past medical history reviewed and updated as indicated.   Allergies and medications reviewed and updated.   Current Outpatient Medications  Medication Instructions   amphetamine-dextroamphetamine (ADDERALL) 20 MG tablet 20 mg, Every morning   busPIRone (BUSPAR) 10 mg, 2 times daily   cephALEXin  (KEFLEX ) 500 mg, Oral, 4 times daily   FLUoxetine (PROZAC) 40 MG capsule TAKE 1 CAPSULE BY MOUTH DAILY FOR DEPRESSION OR ANXIETY   hydrOXYzine (ATARAX) 10 mg, 2 times daily   loratadine  (CLARITIN ) 10 mg, Oral, Daily     Allergies[1]  Objective:   There were no vitals taken for this visit.   Physical Exam Constitutional:      Appearance: Normal appearance.  HENT:     Head: Normocephalic.  Eyes:     Conjunctiva/sclera: Conjunctivae normal.  Cardiovascular:     Rate and Rhythm: Normal rate and regular rhythm.  Pulmonary:     Effort: Pulmonary effort is normal.     Breath sounds: Normal breath sounds.  Skin:    General: Skin is warm and dry.  Neurological:     General: No focal deficit present.     Mental Status: She is alert and oriented to  person, place, and time.  Psychiatric:        Mood and Affect: Mood normal.        Behavior: Behavior normal.        Thought Content: Thought content normal.        Judgment: Judgment normal.     Assessment & Plan:   Assessment & Plan Attention deficit hyperactivity disorder (ADHD), predominantly inattentive type     Moderate episode of recurrent major depressive disorder (HCC)     Anxiety    Continue Prozac 40 mg po qam for depression/anxiety Continue Buspar 15 mg po bid for anxiety Increase Adderall 20 mg po qam to bid (morning and noon) for ADHD    Follow up plan: Return in about 2 months (around 11/29/2024) for Medication Follow-up.  Florencia Cousin, NP       [1] No Known Allergies  "

## 2024-10-18 ENCOUNTER — Telehealth

## 2024-10-18 DIAGNOSIS — N390 Urinary tract infection, site not specified: Secondary | ICD-10-CM | POA: Insufficient documentation

## 2024-10-18 MED ORDER — CEPHALEXIN 500 MG PO CAPS: 500.0000 mg | ORAL_CAPSULE | Freq: Two times a day (BID) | ORAL | 0 refills | Status: AC

## 2024-10-18 NOTE — Progress Notes (Signed)
" ° °  Acute Office Visit  Subjective:     Patient ID: Kelli Bean, female    DOB: 1986/04/19, 39 y.o.   MRN: [id number]  No chief complaint on file.   Patient is a 39 year old female who presents via telehealth. She states since last week she has had a smell and difficulty with urination. She reports having vaginal pain that was uncomfortable. She does not urinate the same before her children. She did use a boric acid suppository and probiotic. She reports her urine was discolored light pink in color. She has a prior history of urinary tract infections and was given a script for Kelfex.     Review of Systems  Constitutional: Negative.   HENT: Negative.    Eyes: Negative.   Respiratory: Negative.    Cardiovascular: Negative.   Gastrointestinal: Negative.   Genitourinary:  Positive for flank pain, frequency and urgency.  Skin: Negative.   Neurological: Negative.   Endo/Heme/Allergies: Negative.   Psychiatric/Behavioral: Negative.          Objective:    There were no vitals taken for this visit.   Physical Exam Musculoskeletal:        General: Normal range of motion.     Cervical back: Normal range of motion.  Skin:    General: Skin is warm and dry.  Neurological:     General: No focal deficit present.     Mental Status: She is alert and oriented to person, place, and time.  Psychiatric:        Mood and Affect: Mood normal.        Behavior: Behavior normal.        Thought Content: Thought content normal.        Judgment: Judgment normal.     No results found for any visits on 10/18/24.      Assessment & Plan:   Problem List Items Addressed This Visit       Genitourinary   Urinary tract infection without hematuria - Primary   Start Kelfex 500 mg BID x 5 days to see if that helps with symptoms. If no improvement noted, make an in person visit.       No orders of the defined types were placed in this encounter.   Return if symptoms worsen or fail  to improve.  Austine Cork, FNP   "

## 2024-10-18 NOTE — Assessment & Plan Note (Signed)
 Start Kelfex 500 mg BID x 5 days to see if that helps with symptoms. If no improvement noted, make an in person visit.

## 2024-11-26 ENCOUNTER — Telehealth: Admitting: Nurse Practitioner

## 2024-12-10 ENCOUNTER — Ambulatory Visit: Admitting: Nurse Practitioner
